# Patient Record
Sex: Female | Born: 1955 | Race: White | Hispanic: No | Smoking: Never smoker
Health system: Southern US, Community
[De-identification: ages and names within clinical notes are randomized; demographics above are authoritative.]

## PROBLEM LIST (undated history)

## (undated) DIAGNOSIS — K219 Gastro-esophageal reflux disease without esophagitis: Secondary | ICD-10-CM

## (undated) DIAGNOSIS — R1901 Right upper quadrant abdominal swelling, mass and lump: Secondary | ICD-10-CM

## (undated) DIAGNOSIS — Z9889 Other specified postprocedural states: Secondary | ICD-10-CM

## (undated) DIAGNOSIS — M720 Palmar fascial fibromatosis [Dupuytren]: Secondary | ICD-10-CM

## (undated) DIAGNOSIS — R030 Elevated blood-pressure reading, without diagnosis of hypertension: Secondary | ICD-10-CM

## (undated) DIAGNOSIS — M8580 Other specified disorders of bone density and structure, unspecified site: Secondary | ICD-10-CM

## (undated) DIAGNOSIS — B019 Varicella without complication: Secondary | ICD-10-CM

## (undated) DIAGNOSIS — B059 Measles without complication: Secondary | ICD-10-CM

## (undated) DIAGNOSIS — R42 Dizziness and giddiness: Secondary | ICD-10-CM

## (undated) DIAGNOSIS — Z9289 Personal history of other medical treatment: Secondary | ICD-10-CM

## (undated) DIAGNOSIS — R112 Nausea with vomiting, unspecified: Secondary | ICD-10-CM

## (undated) DIAGNOSIS — E785 Hyperlipidemia, unspecified: Secondary | ICD-10-CM

## (undated) DIAGNOSIS — M169 Osteoarthritis of hip, unspecified: Principal | ICD-10-CM

## (undated) DIAGNOSIS — T7840XA Allergy, unspecified, initial encounter: Secondary | ICD-10-CM

## (undated) DIAGNOSIS — M1990 Unspecified osteoarthritis, unspecified site: Secondary | ICD-10-CM

## (undated) DIAGNOSIS — L989 Disorder of the skin and subcutaneous tissue, unspecified: Secondary | ICD-10-CM

## (undated) DIAGNOSIS — K649 Unspecified hemorrhoids: Secondary | ICD-10-CM

## (undated) HISTORY — DX: Right upper quadrant abdominal swelling, mass and lump: R19.01

## (undated) HISTORY — DX: Elevated blood-pressure reading, without diagnosis of hypertension: R03.0

## (undated) HISTORY — DX: Palmar fascial fibromatosis (dupuytren): M72.0

## (undated) HISTORY — DX: Hypercalcemia: E83.52

## (undated) HISTORY — DX: Osteoarthritis of hip, unspecified: M16.9

## (undated) HISTORY — DX: Measles without complication: B05.9

## (undated) HISTORY — DX: Unspecified hemorrhoids: K64.9

## (undated) HISTORY — DX: Varicella without complication: B01.9

## (undated) HISTORY — PX: TONSILLECTOMY: SUR1361

## (undated) HISTORY — DX: Allergy, unspecified, initial encounter: T78.40XA

## (undated) HISTORY — DX: Hyperlipidemia, unspecified: E78.5

## (undated) HISTORY — PX: OTHER SURGICAL HISTORY: SHX169

## (undated) HISTORY — PX: SKIN LESION EXCISION: SHX2412

## (undated) HISTORY — DX: Dizziness and giddiness: R42

## (undated) HISTORY — DX: Unspecified osteoarthritis, unspecified site: M19.90

## (undated) HISTORY — DX: Other specified disorders of bone density and structure, unspecified site: M85.80

## (undated) HISTORY — DX: Disorder of the skin and subcutaneous tissue, unspecified: L98.9

---

## 1998-05-06 ENCOUNTER — Ambulatory Visit (HOSPITAL_COMMUNITY): Admission: RE | Admit: 1998-05-06 | Discharge: 1998-05-06 | Payer: Self-pay | Admitting: Obstetrics and Gynecology

## 2004-02-29 ENCOUNTER — Encounter: Admission: RE | Admit: 2004-02-29 | Discharge: 2004-02-29 | Payer: Self-pay | Admitting: Family Medicine

## 2004-03-22 ENCOUNTER — Other Ambulatory Visit: Admission: RE | Admit: 2004-03-22 | Discharge: 2004-03-22 | Payer: Self-pay | Admitting: Family Medicine

## 2006-07-12 ENCOUNTER — Ambulatory Visit: Payer: Self-pay | Admitting: Family Medicine

## 2007-07-17 ENCOUNTER — Ambulatory Visit: Payer: Self-pay | Admitting: Family Medicine

## 2007-12-31 ENCOUNTER — Ambulatory Visit: Payer: Self-pay | Admitting: Family Medicine

## 2008-04-02 HISTORY — PX: COLONOSCOPY: SHX174

## 2008-04-02 LAB — HM MAMMOGRAPHY

## 2008-11-10 ENCOUNTER — Ambulatory Visit: Payer: Self-pay | Admitting: Family Medicine

## 2008-11-10 ENCOUNTER — Other Ambulatory Visit: Admission: RE | Admit: 2008-11-10 | Discharge: 2008-11-10 | Payer: Self-pay | Admitting: Family Medicine

## 2008-11-17 ENCOUNTER — Encounter: Admission: RE | Admit: 2008-11-17 | Discharge: 2008-11-17 | Payer: Self-pay | Admitting: Family Medicine

## 2009-02-07 ENCOUNTER — Ambulatory Visit: Payer: Self-pay | Admitting: Family Medicine

## 2009-02-09 ENCOUNTER — Ambulatory Visit: Payer: Self-pay | Admitting: Family Medicine

## 2009-04-02 LAB — HM COLONOSCOPY

## 2009-05-13 ENCOUNTER — Ambulatory Visit: Payer: Self-pay | Admitting: Family Medicine

## 2009-06-07 ENCOUNTER — Ambulatory Visit: Payer: Self-pay | Admitting: Family Medicine

## 2009-08-08 ENCOUNTER — Ambulatory Visit: Payer: Self-pay | Admitting: Physician Assistant

## 2010-04-24 ENCOUNTER — Encounter: Payer: Self-pay | Admitting: Family Medicine

## 2010-08-05 ENCOUNTER — Encounter: Payer: Self-pay | Admitting: Family Medicine

## 2010-08-05 DIAGNOSIS — E559 Vitamin D deficiency, unspecified: Secondary | ICD-10-CM | POA: Insufficient documentation

## 2010-12-02 LAB — HM PAP SMEAR

## 2011-01-08 ENCOUNTER — Encounter: Payer: Self-pay | Admitting: Medical

## 2011-01-08 ENCOUNTER — Ambulatory Visit (INDEPENDENT_AMBULATORY_CARE_PROVIDER_SITE_OTHER): Payer: BC Managed Care – PPO | Admitting: Medical

## 2011-01-08 VITALS — BP 120/80 | HR 62 | Temp 97.9°F | Resp 18 | Ht 71.0 in | Wt 189.0 lb

## 2011-01-08 DIAGNOSIS — L720 Epidermal cyst: Secondary | ICD-10-CM

## 2011-01-08 DIAGNOSIS — L723 Sebaceous cyst: Secondary | ICD-10-CM

## 2011-01-08 NOTE — Patient Instructions (Signed)
If lesion is becoming painful, larger, or giving you problems in 1-2 months, call or return.

## 2011-01-08 NOTE — Progress Notes (Signed)
  Subjective:   HPI  Michelle Romero is a 55 y.o. female who presents for concern of left thumb lesion.   Lesion of left thumb appeared as raised spongy bump a week ago.  She notes that a tick bit her several months ago in the same place, thought this may be related to the new lesion.  Back months ago when she had the tick bite, she doesn't recall getting sick.  She notes otherwise feeling fine today, but a month ago thought she had Chad Nile virus due to headache, aches, fever, but this resolved.  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 The thumb lesion doesn't hurt.  Has put neosporin on the lesion without relief.  No other aggravating or relieving factors.  No other c/o.  The following portions of the patient's history were reviewed and updated as appropriate: allergies, current medications, past family history, past medical history, past social history, past surgical history and problem list.  Past Medical History  Diagnosis Date  . Hyperlipidemia   . Vitamin D deficiency       Review of Systems Constitutional: 17mo ago with fever, chills, aches;  denies sweats, unexpected weight change, fatigue ENT: no runny nose, ear pain, sore throat Cardiology: denies chest pain, palpitations, edema Respiratory: denies cough, shortness of breath, wheezing Gastroenterology: denies abdominal pain, nausea, vomiting, diarrhea, constipation Musculoskeletal: 17mo ago with aches, but otherwise denies back pain Urology: denies dysuria, difficulty urinating Neurology: bad headache 1 mo ago, but no weakness, tingling, numbness      Objective:   Physical Exam  General appearance: alert, no distress, WD/WN, white female Skin: left dorsal distal thumb with 1cm raised spongy flesh colored papular lesion, but no induration, erythema, no obvious fluctuance, warmth or fluid.  Seems spongy such as lipoma texture Heart: RRR, normal S1, S2, no murmurs Lungs: CTA bilaterally, no  wheezes, rhonchi, or rales Pulses: 2+ symmetric, upper and lower extremities, normal cap refill Neuro: normal sensation and strength of thumb and fingers   Assessment :    Encounter Diagnosis  Name Primary?  . Inclusion cyst Yes   Plan:   Reassured.   Advised if lesion not going away, or becoming troublesome over the next 1-2 mo, call or return, otherwise reassure that she can just leave it alone.

## 2011-04-25 ENCOUNTER — Ambulatory Visit
Admission: RE | Admit: 2011-04-25 | Discharge: 2011-04-25 | Disposition: A | Discharge status: Home / Self Care | Payer: BC Managed Care – PPO | Source: Ambulatory Visit | Attending: Medical | Admitting: Medical

## 2011-04-25 ENCOUNTER — Ambulatory Visit (INDEPENDENT_AMBULATORY_CARE_PROVIDER_SITE_OTHER): Payer: BC Managed Care – PPO | Admitting: Medical

## 2011-04-25 ENCOUNTER — Encounter: Payer: Self-pay | Admitting: Medical

## 2011-04-25 VITALS — BP 128/70 | HR 68 | Temp 97.6°F | Resp 16 | Wt 140.0 lb

## 2011-04-25 DIAGNOSIS — S60419A Abrasion of unspecified finger, initial encounter: Secondary | ICD-10-CM | POA: Insufficient documentation

## 2011-04-25 DIAGNOSIS — IMO0002 Reserved for concepts with insufficient information to code with codable children: Secondary | ICD-10-CM

## 2011-04-25 DIAGNOSIS — M79646 Pain in unspecified finger(s): Secondary | ICD-10-CM

## 2011-04-25 DIAGNOSIS — S62609A Fracture of unspecified phalanx of unspecified finger, initial encounter for closed fracture: Secondary | ICD-10-CM | POA: Insufficient documentation

## 2011-04-25 DIAGNOSIS — M79609 Pain in unspecified limb: Secondary | ICD-10-CM

## 2011-04-25 DIAGNOSIS — S6990XA Unspecified injury of unspecified wrist, hand and finger(s), initial encounter: Secondary | ICD-10-CM | POA: Insufficient documentation

## 2011-04-25 NOTE — Progress Notes (Signed)
  Subjective:   HPI Michelle Romero is a 56 y.o. female who presents for finger injury.  She was leading her horse through the gait on Saturday 5 days ago, when something spooked the horse, and the horse pulled on a rope which she was holding. She notes that the rope burned both of her ring fingers, and since then she has had pain and swelling in the ring finger on both hands.  The left hand is not as bad as the right hand, but she has lost some of the ability to completely extend her right ring finger.   She is using Neosporin on the abrasion, ice for swelling.  No other aggravating or relieving factors.  No other c/o.  The following portions of the patient's history were reviewed and updated as appropriate: allergies, current medications, past family history, past medical history, past social history, past surgical history and problem list.  Past Medical History  Diagnosis Date  . Hyperlipidemia   . Vitamin D deficiency     Review of Systems Gen.: No fever, chills, sweats Neuro: No numbness, weakness, tingling     Objective:   Physical Exam  General appearance: alert, no distress, WD/WN Skin: Left fourth finger volar aspect with 2.5 x 1 cm abrasion, right fourth finger along side nailbed with small abrasion MSK: Right fourth finger distal phalanx with swelling, erythema, and some purplish ecchymosis, tender to touch, unable to fully extend the distal phalanx of the same finger, decreased range of motion of the fourth finger. Left fourth finger with mild erythema, swelling, and slight purplish ecchymosis the distal phalanx, but normal range of motion. Mildly tender distal phalanx of the left fourth finger.  Otherwise bilateral hands and fingers nontender, normal range of motion, no obvious deformity Pulses: Normal cap refill bilateral, 2+ pulses of upper extremities Neuro:  Normal sensation bilateral hands and fingers   Assessment and Plan:    Encounter Diagnoses  Name Primary?  .  Finger injury Yes  . Abrasion of finger   . Finger pain   . Finger fracture, right    I sent the patient for x-ray today.   Results showed Xray right 4th finger: Oblique nondisplaced fracture through the base of the distal phalanx of the right fourth digit which may extend intra-articular to involve the fourth DIP joint.  Advise she keep both hands and fingers clean, continue over-the-counter Neosporin for the abrasions, discussed signs of worsening infection that would prompt recheck.  Advise she use over-the-counter splint and buddy taping for the left ring finger, elevation, ice. For tonight she can do the same with the right ring finger, ice, elevation. She can use over-the-counter Aleve or ibuprofen for pain and inflammation. We'll refer to orthopedics and try and get her in tomorrow for further evaluation and management.

## 2011-04-26 ENCOUNTER — Telehealth: Payer: Self-pay | Admitting: Family Medicine

## 2011-04-26 NOTE — Telephone Encounter (Signed)
PATIENT HAS AN APPOINTMENT WITH DR. HILTS AT [facility] ON 04/26/11 @145PM . CLS

## 2011-04-26 NOTE — Telephone Encounter (Signed)
Message copied by Janeice Robinson on Thu Apr 26, 2011  9:26 AM ------      Message from: Aleen Campi, DAVID S      Created: Wed Apr 25, 2011  7:34 PM       I spoke to patient last evening about the finger fracture. Please refer her to orthopedics for appointment on Thursday. Please send my office note, and copy of x-ray report. She has seen both [facility] and [facility] in the past. Try either of these.             RE: right 4th finger oblique fracture.

## 2011-10-30 ENCOUNTER — Ambulatory Visit (INDEPENDENT_AMBULATORY_CARE_PROVIDER_SITE_OTHER): Payer: BC Managed Care – PPO | Admitting: Family Medicine

## 2011-10-30 ENCOUNTER — Encounter: Payer: Self-pay | Admitting: Family Medicine

## 2011-10-30 VITALS — BP 138/78 | HR 80 | Temp 97.4°F | Ht 71.0 in | Wt 186.8 lb

## 2011-10-30 DIAGNOSIS — M25551 Pain in right hip: Secondary | ICD-10-CM

## 2011-10-30 DIAGNOSIS — B9789 Other viral agents as the cause of diseases classified elsewhere: Secondary | ICD-10-CM

## 2011-10-30 DIAGNOSIS — M1990 Unspecified osteoarthritis, unspecified site: Secondary | ICD-10-CM

## 2011-10-30 DIAGNOSIS — M129 Arthropathy, unspecified: Secondary | ICD-10-CM

## 2011-10-30 DIAGNOSIS — L989 Disorder of the skin and subcutaneous tissue, unspecified: Secondary | ICD-10-CM

## 2011-10-30 DIAGNOSIS — B349 Viral infection, unspecified: Secondary | ICD-10-CM

## 2011-10-30 DIAGNOSIS — E663 Overweight: Secondary | ICD-10-CM

## 2011-10-30 DIAGNOSIS — E559 Vitamin D deficiency, unspecified: Secondary | ICD-10-CM

## 2011-10-30 DIAGNOSIS — Z6827 Body mass index (BMI) 27.0-27.9, adult: Secondary | ICD-10-CM | POA: Insufficient documentation

## 2011-10-30 DIAGNOSIS — R319 Hematuria, unspecified: Secondary | ICD-10-CM

## 2011-10-30 DIAGNOSIS — R079 Chest pain, unspecified: Secondary | ICD-10-CM

## 2011-10-30 DIAGNOSIS — R109 Unspecified abdominal pain: Secondary | ICD-10-CM

## 2011-10-30 DIAGNOSIS — M25559 Pain in unspecified hip: Secondary | ICD-10-CM

## 2011-10-30 DIAGNOSIS — R42 Dizziness and giddiness: Secondary | ICD-10-CM

## 2011-10-30 HISTORY — DX: Dizziness and giddiness: R42

## 2011-10-30 HISTORY — DX: Disorder of the skin and subcutaneous tissue, unspecified: L98.9

## 2011-10-30 LAB — POCT URINALYSIS DIPSTICK
Bilirubin, UA: NEGATIVE
Glucose, UA: NEGATIVE
Ketones, UA: NEGATIVE
Leukocytes, UA: NEGATIVE
Nitrite, UA: NEGATIVE
Protein, UA: NEGATIVE
Spec Grav, UA: <=1.005
Urobilinogen, UA: 0.2
pH, UA: 5.5

## 2011-10-30 NOTE — Assessment & Plan Note (Signed)
Follows with dermatology, Dr Charlton Haws

## 2011-10-30 NOTE — Assessment & Plan Note (Signed)
Pain is helped with Tramadol prn. Does not respond to or tolerate NSAIDs

## 2011-10-30 NOTE — Progress Notes (Signed)
Patient ID: Michelle Romero, female   DOB: 1955/11/04, 56 y.o.   MRN: [id number] MARSHAYLA MITSCHKE [id number] 1955/05/05 10/30/2011      Progress Note-Follow Up  Subjective  Chief Complaint  Chief Complaint  Patient presents with  . Establish Care    new patient- transfer from Dr Susann Givens    HPI  Patient is Caucasian female who is here today to establish care. She is chronic right hip pain and arthritic changes. She does get some relief with joint injections and is trying over-the-counter supplements such as glucosamine pain continues to worsen. She is in today in a referral. She had an episode couple weeks ago where she stopped taking meloxicam. She reports she took a dose she felt lightheaded and vertiginous. She felt sweaty and nauseous and had some chest pain as well since stopping the medication she's had no further episodes. Today she reports she feels tired. She struggling with some anorexia as well as some nausea and some diarrhea. She has some mild congestion and some low. She's felt poorly for several days now. No high-grade fevers or worsening symptoms. No GI or GU complaints otherwise noted.  Past Medical History  Diagnosis Date  . Vitamin d deficiency   . Chicken pox as a child  . Measles as a child  . 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 Hyperlipidemia   . Overweight   . Precancerous skin lesion 10/30/2011    Left abdomen, excised  . Viral infection 10/30/2011  . Vertigo 10/30/2011    Past Surgical History  Procedure Date  . Colonoscopy 2010    [facility]  . Broke wrist 56 yrs old    plate placed by Dr Chaney Malling at [facility]  . Skin lesion excision     premelanoma on left abdominal wall    Family History  Problem Relation Age of Onset  . Stroke Mother   . Hypertension Mother   . Diabetes Mother     type 2  . Dementia Mother   . Cancer Father     melanoma- neck  . Arthritis Father   . Fibromyalgia Sister   . Cancer Maternal Grandmother     pancreatic  . Heart attack  Maternal Grandfather   . Cancer Paternal Grandmother     ?  Marland Kitchen Heart disease Paternal Grandfather     pacemaker  . Cancer Sister 67    ovarian  . Arthritis Brother     History   Social History  . Marital Status: Married    Spouse Name: N/A    Number of Children: N/A  . Years of Education: N/A   Occupational History  . Not on file.   Social History Main Topics  . Smoking status: Never Smoker   . Smokeless tobacco: Never Used  . Alcohol Use: 0.5 oz/week    1 drink(s) per week     rarely  . Drug Use: No  . Sexually Active: Not Currently   Other Topics Concern  . Not on file   Social History Narrative  . No narrative on file    No current outpatient prescriptions on file prior to visit.    Allergies  Allergen Reactions  . Codeine   . Meloxicam     Vertigo, dizziness, sweating, chest tightness    Review of Systems  Review of Systems  Constitutional: Positive for malaise/fatigue. Negative for fever.  HENT: Positive for congestion.   Eyes: Negative for discharge.  Respiratory: Negative for shortness of breath.  Cardiovascular: Negative for chest pain, palpitations and leg swelling.  Gastrointestinal: Positive for nausea, abdominal pain and diarrhea. Negative for constipation, blood in stool and melena.  Genitourinary: Negative for dysuria.  Musculoskeletal: Negative for falls.  Skin: Negative for rash.  Neurological: Positive for headaches. Negative for loss of consciousness.  Endo/Heme/Allergies: Negative for polydipsia.  Psychiatric/Behavioral: Negative for depression and suicidal ideas. The patient is not nervous/anxious and does not have insomnia.     Objective  BP 138/78  Pulse 80  Temp 97.4 F (36.3 C) (Temporal)  Ht 5\' 11"  (1.803 m)  Wt 186 lb 12.8 oz (84.732 kg)  BMI 26.05 kg/m2  SpO2 98%  Physical Exam  Physical Exam  Constitutional: She is oriented to person, place, and time and well-developed, well-nourished, and in no distress. No  distress.  HENT:  Head: Normocephalic and atraumatic.  Right Ear: External ear normal.  Left Ear: External ear normal.  Nose: Nose normal.  Mouth/Throat: Oropharynx is clear and moist. No oropharyngeal exudate.  Eyes: Conjunctivae are normal. Pupils are equal, round, and reactive to light. Right eye exhibits no discharge. Left eye exhibits no discharge. No scleral icterus.  Neck: Normal range of motion. Neck supple. No thyromegaly present.  Cardiovascular: Normal rate, regular rhythm, normal heart sounds and intact distal pulses.   No murmur heard. Pulmonary/Chest: Effort normal and breath sounds normal. No respiratory distress. She has no wheezes. She has no rales.  Abdominal: Soft. Bowel sounds are normal. She exhibits no distension and no mass. There is no tenderness.  Musculoskeletal: Normal range of motion. She exhibits no edema and no tenderness.  Lymphadenopathy:    She has no cervical adenopathy.  Neurological: She is alert and oriented to person, place, and time. She has normal reflexes. No cranial nerve deficit. Coordination normal.  Skin: Skin is warm and dry. No rash noted. She is not diaphoretic.  Psychiatric: Mood, memory and affect normal.     Assessment & Plan  Precancerous skin lesion Follows with dermatology, Dr Charlton Haws  Overweight Given paperwork on DASH diet check labs  Viral infection Encouraged increased rest and fluids and report worsening symptoms  Vitamin d deficiency Repeat level and start citracal  Arthritis Pain is helped with Tramadol prn. Does not respond to or tolerate NSAIDs  Vertigo Had an episode about 3 weeks ago with with nausea, diaphoresis, chest pain, resolved in a single morning and has not recurred. Will report any concerning symptoms. EKG normal

## 2011-10-30 NOTE — Assessment & Plan Note (Signed)
Repeat level and start citracal

## 2011-10-30 NOTE — Assessment & Plan Note (Signed)
Encouraged increased rest and fluids and report worsening symptoms

## 2011-10-30 NOTE — Patient Instructions (Addendum)
Preventive Care for Adults, Female A healthy lifestyle and preventive care can promote health and wellness. Preventive health guidelines for women include the following key practices.  A routine yearly physical is a good way to check with your caregiver about your health and preventive screening. It is a chance to share any concerns and updates on your health, and to receive a thorough exam.   Visit your dentist for a routine exam and preventive care every 6 months. Brush your teeth twice a day and floss once a day. Good oral hygiene prevents tooth decay and gum disease.   The frequency of eye exams is based on your age, health, family medical history, use of contact lenses, and other factors. Follow your caregiver's recommendations for frequency of eye exams.   Eat a healthy diet. Foods like vegetables, fruits, whole grains, low-fat dairy products, and lean protein foods contain the nutrients you need without too many calories. Decrease your intake of foods high in solid fats, added sugars, and salt. Eat the right amount of calories for you.Get information about a proper diet from your caregiver, if necessary.   Regular physical exercise is one of the most important things you can do for your health. Most adults should get at least 150 minutes of moderate-intensity exercise (any activity that increases your heart rate and causes you to sweat) each week. In addition, most adults need muscle-strengthening exercises on 2 or more days a week.   Maintain a healthy weight. The body mass index (BMI) is a screening tool to identify possible weight problems. It provides an estimate of body fat based on height and weight. Your caregiver can help determine your BMI, and can help you achieve or maintain a healthy weight.For adults 20 years and older:   A BMI below 18.5 is considered underweight.   A BMI of 18.5 to 24.9 is normal.   A BMI of 25 to 29.9 is considered overweight.   A BMI of 30 and above is  considered obese.   Maintain normal blood lipids and cholesterol levels by exercising and minimizing your intake of saturated fat. Eat a balanced diet with plenty of fruit and vegetables. Blood tests for lipids and cholesterol should begin at age 20 and be repeated every 5 years. If your lipid or cholesterol levels are high, you are over 50, or you are at high risk for heart disease, you may need your cholesterol levels checked more frequently.Ongoing high lipid and cholesterol levels should be treated with medicines if diet and exercise are not effective.   If you smoke, find out from your caregiver how to quit. If you do not use tobacco, do not start.   If you are pregnant, do not drink alcohol. If you are breastfeeding, be very cautious about drinking alcohol. If you are not pregnant and choose to drink alcohol, do not exceed 1 drink per day. One drink is considered to be 12 ounces (355 mL) of beer, 5 ounces (148 mL) of wine, or 1.5 ounces (44 mL) of liquor.   Avoid use of street drugs. Do not share needles with anyone. Ask for help if you need support or instructions about stopping the use of drugs.   High blood pressure causes heart disease and increases the risk of stroke. Your blood pressure should be checked at least every 1 to 2 years. Ongoing high blood pressure should be treated with medicines if weight loss and exercise are not effective.   If you are 55 to 57   years old, ask your caregiver if you should take aspirin to prevent strokes.   Diabetes screening involves taking a blood sample to check your fasting blood sugar level. This should be done once every 3 years, after age 45, if you are within normal weight and without risk factors for diabetes. Testing should be considered at a younger age or be carried out more frequently if you are overweight and have at least 1 risk factor for diabetes.   Breast cancer screening is essential preventive care for women. You should practice "breast  self-awareness." This means understanding the normal appearance and feel of your breasts and may include breast self-examination. Any changes detected, no matter how small, should be reported to a caregiver. Women in their 20s and 30s should have a clinical breast exam (CBE) by a caregiver as part of a regular health exam every 1 to 3 years. After age 40, women should have a CBE every year. Starting at age 40, women should consider having a mammography (breast X-ray test) every year. Women who have a family history of breast cancer should talk to their caregiver about genetic screening. Women at a high risk of breast cancer should talk to their caregivers about having magnetic resonance imaging (MRI) and a mammography every year.   The Pap test is a screening test for cervical cancer. A Pap test can show cell changes on the cervix that might become cervical cancer if left untreated. A Pap test is a procedure in which cells are obtained and examined from the lower end of the uterus (cervix).   Women should have a Pap test starting at age 21.   Between ages 21 and 29, Pap tests should be repeated every 2 years.   Beginning at age 30, you should have a Pap test every 3 years as long as the past 3 Pap tests have been normal.   Some women have medical problems that increase the chance of getting cervical cancer. Talk to your caregiver about these problems. It is especially important to talk to your caregiver if a new problem develops soon after your last Pap test. In these cases, your caregiver may recommend more frequent screening and Pap tests.   The above recommendations are the same for women who have or have not gotten the vaccine for human papillomavirus (HPV).   If you had a hysterectomy for a problem that was not cancer or a condition that could lead to cancer, then you no longer need Pap tests. Even if you no longer need a Pap test, a regular exam is a good idea to make sure no other problems are  starting.   If you are between ages 65 and 70, and you have had normal Pap tests going back 10 years, you no longer need Pap tests. Even if you no longer need a Pap test, a regular exam is a good idea to make sure no other problems are starting.   If you have had past treatment for cervical cancer or a condition that could lead to cancer, you need Pap tests and screening for cancer for at least 20 years after your treatment.   If Pap tests have been discontinued, risk factors (such as a new sexual partner) need to be reassessed to determine if screening should be resumed.   The HPV test is an additional test that may be used for cervical cancer screening. The HPV test looks for the virus that can cause the cell changes on the cervix.   The cells collected during the Pap test can be tested for HPV. The HPV test could be used to screen women aged 30 years and older, and should be used in women of any age who have unclear Pap test results. After the age of 30, women should have HPV testing at the same frequency as a Pap test.   Colorectal cancer can be detected and often prevented. Most routine colorectal cancer screening begins at the age of 50 and continues through age 75. However, your caregiver may recommend screening at an earlier age if you have risk factors for colon cancer. On a yearly basis, your caregiver may provide home test kits to check for hidden blood in the stool. Use of a small camera at the end of a tube, to directly examine the colon (sigmoidoscopy or colonoscopy), can detect the earliest forms of colorectal cancer. Talk to your caregiver about this at age 50, when routine screening begins. Direct examination of the colon should be repeated every 5 to 10 years through age 75, unless early forms of pre-cancerous polyps or small growths are found.   Hepatitis C blood testing is recommended for all people born from 1945 through 1965 and any individual with known risks for hepatitis C.    Practice safe sex. Use condoms and avoid high-risk sexual practices to reduce the spread of sexually transmitted infections (STIs). STIs include gonorrhea, chlamydia, syphilis, trichomonas, herpes, HPV, and human immunodeficiency virus (HIV). Herpes, HIV, and HPV are viral illnesses that have no cure. They can result in disability, cancer, and death. Sexually active women aged 25 and younger should be checked for chlamydia. Older women with new or multiple partners should also be tested for chlamydia. Testing for other STIs is recommended if you are sexually active and at increased risk.   Osteoporosis is a disease in which the bones lose minerals and strength with aging. This can result in serious bone fractures. The risk of osteoporosis can be identified using a bone density scan. Women ages 65 and over and women at risk for fractures or osteoporosis should discuss screening with their caregivers. Ask your caregiver whether you should take a calcium supplement or vitamin D to reduce the rate of osteoporosis.   Menopause can be associated with physical symptoms and risks. Hormone replacement therapy is available to decrease symptoms and risks. You should talk to your caregiver about whether hormone replacement therapy is right for you.   Use sunscreen with sun protection factor (SPF) of 30 or more. Apply sunscreen liberally and repeatedly throughout the day. You should seek shade when your shadow is shorter than you. Protect yourself by wearing long sleeves, pants, a wide-brimmed hat, and sunglasses year round, whenever you are outdoors.   Once a month, do a whole body skin exam, using a mirror to look at the skin on your back. Notify your caregiver of new moles, moles that have irregular borders, moles that are larger than a pencil eraser, or moles that have changed in shape or color.   Stay current with required immunizations.   Influenza. You need a dose every fall (or winter). The composition of  the flu vaccine changes each year, so being vaccinated once is not enough.   Pneumococcal polysaccharide. You need 1 to 2 doses if you smoke cigarettes or if you have certain chronic medical conditions. You need 1 dose at age 65 (or older) if you have never been vaccinated.   Tetanus, diphtheria, pertussis (Tdap, Td). Get 1 dose of   Tdap vaccine if you are younger than age 65, are over 65 and have contact with an infant, are a healthcare worker, are pregnant, or simply want to be protected from whooping cough. After that, you need a Td booster dose every 10 years. Consult your caregiver if you have not had at least 3 tetanus and diphtheria-containing shots sometime in your life or have a deep or dirty wound.   HPV. You need this vaccine if you are a woman age 26 or younger. The vaccine is given in 3 doses over 6 months.   Measles, mumps, rubella (MMR). You need at least 1 dose of MMR if you were born in 1957 or later. You may also need a second dose.   Meningococcal. If you are age 19 to 21 and a first-year college student living in a residence hall, or have one of several medical conditions, you need to get vaccinated against meningococcal disease. You may also need additional booster doses.   Zoster (shingles). If you are age 60 or older, you should get this vaccine.   Varicella (chickenpox). If you have never had chickenpox or you were vaccinated but received only 1 dose, talk to your caregiver to find out if you need this vaccine.   Hepatitis A. You need this vaccine if you have a specific risk factor for hepatitis A virus infection or you simply wish to be protected from this disease. The vaccine is usually given as 2 doses, 6 to 18 months apart.   Hepatitis B. You need this vaccine if you have a specific risk factor for hepatitis B virus infection or you simply wish to be protected from this disease. The vaccine is given in 3 doses, usually over 6 months.  Preventive Services /  Frequency Ages 19 to 39  Blood pressure check.** / Every 1 to 2 years.   Lipid and cholesterol check.** / Every 5 years beginning at age 20.   Clinical breast exam.** / Every 3 years for women in their 20s and 30s.   Pap test.** / Every 2 years from ages 21 through 29. Every 3 years starting at age 30 through age 65 or 70 with a history of 3 consecutive normal Pap tests.   HPV screening.** / Every 3 years from ages 30 through ages 65 to 70 with a history of 3 consecutive normal Pap tests.   Hepatitis C blood test.** / For any individual with known risks for hepatitis C.   Skin self-exam. / Monthly.   Influenza immunization.** / Every year.   Pneumococcal polysaccharide immunization.** / 1 to 2 doses if you smoke cigarettes or if you have certain chronic medical conditions.   Tetanus, diphtheria, pertussis (Tdap, Td) immunization. / A one-time dose of Tdap vaccine. After that, you need a Td booster dose every 10 years.   HPV immunization. / 3 doses over 6 months, if you are 26 and younger.   Measles, mumps, rubella (MMR) immunization. / You need at least 1 dose of MMR if you were born in 1957 or later. You may also need a second dose.   Meningococcal immunization. / 1 dose if you are age 19 to 21 and a first-year college student living in a residence hall, or have one of several medical conditions, you need to get vaccinated against meningococcal disease. You may also need additional booster doses.   Varicella immunization.** / Consult your caregiver.   Hepatitis A immunization.** / Consult your caregiver. 2 doses, 6 to 18 months   apart.   Hepatitis B immunization.** / Consult your caregiver. 3 doses usually over 6 months.  Ages 40 to 64  Blood pressure check.** / Every 1 to 2 years.   Lipid and cholesterol check.** / Every 5 years beginning at age 20.   Clinical breast exam.** / Every year after age 40.   Mammogram.** / Every year beginning at age 40 and continuing for as  long as you are in good health. Consult with your caregiver.   Pap test.** / Every 3 years starting at age 30 through age 65 or 70 with a history of 3 consecutive normal Pap tests.   HPV screening.** / Every 3 years from ages 30 through ages 65 to 70 with a history of 3 consecutive normal Pap tests.   Fecal occult blood test (FOBT) of stool. / Every year beginning at age 50 and continuing until age 75. You may not need to do this test if you get a colonoscopy every 10 years.   Flexible sigmoidoscopy or colonoscopy.** / Every 5 years for a flexible sigmoidoscopy or every 10 years for a colonoscopy beginning at age 50 and continuing until age 75.   Hepatitis C blood test.** / For all people born from 1945 through 1965 and any individual with known risks for hepatitis C.   Skin self-exam. / Monthly.   Influenza immunization.** / Every year.   Pneumococcal polysaccharide immunization.** / 1 to 2 doses if you smoke cigarettes or if you have certain chronic medical conditions.   Tetanus, diphtheria, pertussis (Tdap, Td) immunization.** / A one-time dose of Tdap vaccine. After that, you need a Td booster dose every 10 years.   Measles, mumps, rubella (MMR) immunization. / You need at least 1 dose of MMR if you were born in 1957 or later. You may also need a second dose.   Varicella immunization.** / Consult your caregiver.   Meningococcal immunization.** / Consult your caregiver.   Hepatitis A immunization.** / Consult your caregiver. 2 doses, 6 to 18 months apart.   Hepatitis B immunization.** / Consult your caregiver. 3 doses, usually over 6 months.  Ages 65 and over  Blood pressure check.** / Every 1 to 2 years.   Lipid and cholesterol check.** / Every 5 years beginning at age 20.   Clinical breast exam.** / Every year after age 40.   Mammogram.** / Every year beginning at age 40 and continuing for as long as you are in good health. Consult with your caregiver.   Pap test.** /  Every 3 years starting at age 30 through age 65 or 70 with a 3 consecutive normal Pap tests. Testing can be stopped between 65 and 70 with 3 consecutive normal Pap tests and no abnormal Pap or HPV tests in the past 10 years.   HPV screening.** / Every 3 years from ages 30 through ages 65 or 70 with a history of 3 consecutive normal Pap tests. Testing can be stopped between 65 and 70 with 3 consecutive normal Pap tests and no abnormal Pap or HPV tests in the past 10 years.   Fecal occult blood test (FOBT) of stool. / Every year beginning at age 50 and continuing until age 75. You may not need to do this test if you get a colonoscopy every 10 years.   Flexible sigmoidoscopy or colonoscopy.** / Every 5 years for a flexible sigmoidoscopy or every 10 years for a colonoscopy beginning at age 50 and continuing until age 75.   Hepatitis   C blood test.** / For all people born from 1945 through 1965 and any individual with known risks for hepatitis C.   Osteoporosis screening.** / A one-time screening for women ages 65 and over and women at risk for fractures or osteoporosis.   Skin self-exam. / Monthly.   Influenza immunization.** / Every year.   Pneumococcal polysaccharide immunization.** / 1 dose at age 65 (or older) if you have never been vaccinated.   Tetanus, diphtheria, pertussis (Tdap, Td) immunization. / A one-time dose of Tdap vaccine if you are over 65 and have contact with an infant, are a healthcare worker, or simply want to be protected from whooping cough. After that, you need a Td booster dose every 10 years.   Varicella immunization.** / Consult your caregiver.   Meningococcal immunization.** / Consult your caregiver.   Hepatitis A immunization.** / Consult your caregiver. 2 doses, 6 to 18 months apart.   Hepatitis B immunization.** / Check with your caregiver. 3 doses, usually over 6 months.  ** Family history and personal history of risk and conditions may change your caregiver's  recommendations. Document Released: 05/15/2001 Document Revised: 03/08/2011 Document Reviewed: 08/14/2010 [employer] Patient Information 2012 [employer]. 

## 2011-10-30 NOTE — Assessment & Plan Note (Signed)
Given paperwork on DASH diet check labs

## 2011-10-30 NOTE — Assessment & Plan Note (Signed)
Had an episode about 3 weeks ago with with nausea, diaphoresis, chest pain, resolved in a single morning and has not recurred. Will report any concerning symptoms. EKG normal

## 2011-11-01 LAB — URINE CULTURE: Colony Count: 10000

## 2011-11-02 ENCOUNTER — Other Ambulatory Visit (INDEPENDENT_AMBULATORY_CARE_PROVIDER_SITE_OTHER): Payer: BC Managed Care – PPO

## 2011-11-02 DIAGNOSIS — R109 Unspecified abdominal pain: Secondary | ICD-10-CM

## 2011-11-04 LAB — URINE CULTURE: Colony Count: 10000

## 2011-11-05 ENCOUNTER — Encounter: Payer: Self-pay | Admitting: Family Medicine

## 2011-11-05 NOTE — Progress Notes (Signed)
Quick Note:  Patient Informed and voiced understanding ______ 

## 2011-11-22 ENCOUNTER — Other Ambulatory Visit (INDEPENDENT_AMBULATORY_CARE_PROVIDER_SITE_OTHER): Payer: BC Managed Care – PPO

## 2011-11-22 DIAGNOSIS — Z0000 Encounter for general adult medical examination without abnormal findings: Secondary | ICD-10-CM

## 2011-11-22 DIAGNOSIS — E559 Vitamin D deficiency, unspecified: Secondary | ICD-10-CM

## 2011-11-22 LAB — LIPID PANEL
Cholesterol: 223 mg/dL — ABNORMAL HIGH (ref 0–200)
HDL: 61.3 mg/dL (ref >39.00)
Total CHOL/HDL Ratio: 4
Triglycerides: 83 mg/dL (ref 0.0–149.0)
VLDL: 16.6 mg/dL (ref 0.0–40.0)

## 2011-11-22 LAB — RENAL FUNCTION PANEL
Albumin: 3.6 g/dL (ref 3.5–5.2)
BUN: 12 mg/dL (ref 6–23)
CO2: 29 mEq/L (ref 19–32)
Calcium: 8.9 mg/dL (ref 8.4–10.5)
Chloride: 103 mEq/L (ref 96–112)
Creatinine, Ser: 0.5 mg/dL (ref 0.4–1.2)
GFR: 145.03 mL/min (ref >60.00)
Glucose, Bld: 77 mg/dL (ref 70–99)
Phosphorus: 3.1 mg/dL (ref 2.3–4.6)
Potassium: 4 mEq/L (ref 3.5–5.1)
Sodium: 137 mEq/L (ref 135–145)

## 2011-11-22 LAB — HEPATIC FUNCTION PANEL
ALT: 13 U/L (ref 0–35)
AST: 14 U/L (ref 0–37)
Albumin: 3.6 g/dL (ref 3.5–5.2)
Alkaline Phosphatase: 85 U/L (ref 39–117)
Bilirubin, Direct: 0 mg/dL (ref 0.0–0.3)
Total Bilirubin: 0.7 mg/dL (ref 0.3–1.2)
Total Protein: 6.4 g/dL (ref 6.0–8.3)

## 2011-11-22 LAB — CBC
HCT: 38.1 % (ref 36.0–46.0)
Hemoglobin: 12.6 g/dL (ref 12.0–15.0)
MCHC: 33 g/dL (ref 30.0–36.0)
MCV: 93.5 fl (ref 78.0–100.0)
Platelets: 246 10*3/uL (ref 150.0–400.0)
RBC: 4.08 Mil/uL (ref 3.87–5.11)
RDW: 13.9 % (ref 11.5–14.6)
WBC: 7.2 10*3/uL (ref 4.5–10.5)

## 2011-11-22 LAB — TSH: TSH: 1.28 u[IU]/mL (ref 0.35–5.50)

## 2011-11-22 LAB — LDL CHOLESTEROL, DIRECT: Direct LDL: 153.1 mg/dL

## 2011-11-23 ENCOUNTER — Other Ambulatory Visit: Payer: BC Managed Care – PPO

## 2011-11-26 LAB — VITAMIN D 1,25 DIHYDROXY
Vitamin D 1, 25 (OH)2 Total: 69 pg/mL (ref 18–72)
Vitamin D2 1, 25 (OH)2: <8 pg/mL
Vitamin D3 1, 25 (OH)2: 69 pg/mL

## 2011-11-26 NOTE — Progress Notes (Signed)
Quick Note:  Patient Informed and voiced understanding ______ 

## 2011-11-28 ENCOUNTER — Telehealth: Payer: Self-pay | Admitting: Family Medicine

## 2011-11-28 NOTE — Telephone Encounter (Signed)
LM

## 2011-11-30 ENCOUNTER — Encounter: Payer: Self-pay | Admitting: Family Medicine

## 2011-11-30 ENCOUNTER — Ambulatory Visit (INDEPENDENT_AMBULATORY_CARE_PROVIDER_SITE_OTHER): Payer: BC Managed Care – PPO | Admitting: Family Medicine

## 2011-11-30 VITALS — BP 144/78 | HR 70 | Ht 71.0 in | Wt 183.0 lb

## 2011-11-30 DIAGNOSIS — IMO0001 Reserved for inherently not codable concepts without codable children: Secondary | ICD-10-CM

## 2011-11-30 DIAGNOSIS — E559 Vitamin D deficiency, unspecified: Secondary | ICD-10-CM

## 2011-11-30 DIAGNOSIS — R030 Elevated blood-pressure reading, without diagnosis of hypertension: Secondary | ICD-10-CM | POA: Insufficient documentation

## 2011-11-30 DIAGNOSIS — E663 Overweight: Secondary | ICD-10-CM

## 2011-11-30 DIAGNOSIS — B9789 Other viral agents as the cause of diseases classified elsewhere: Secondary | ICD-10-CM

## 2011-11-30 DIAGNOSIS — E785 Hyperlipidemia, unspecified: Secondary | ICD-10-CM

## 2011-11-30 DIAGNOSIS — B349 Viral infection, unspecified: Secondary | ICD-10-CM

## 2011-11-30 HISTORY — DX: Reserved for inherently not codable concepts without codable children: IMO0001

## 2011-11-30 NOTE — Assessment & Plan Note (Signed)
Avoid trans fats, increase exercise, minimize caloric intake

## 2011-11-30 NOTE — Progress Notes (Signed)
Patient ID: Michelle Romero, female   DOB: 30-Dec-1955, 56 y.o.   MRN: [id number] Michelle Romero [id number] 10-Jun-1955 11/30/2011      Progress Note-Follow Up  Subjective  Chief Complaint  Chief Complaint  Patient presents with  . Follow-up    labs    HPI  Patient is a [age] Caucasian female who is in today for followup of multiple medical problems. She's recently had a steroid injection in her hip at [facility] and feels that has been marginally helpful. Otherwise she reports no acute illness. No trips to the emergency room, congestion, headache, chest pain, palpitations, shortness of breath, GI or GU complaints since her last visit.  Past Medical History  Diagnosis Date  . Vitamin d deficiency   . Chicken pox as a child  . Measles as a child  . 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 Hyperlipidemia   . Overweight   . Precancerous skin lesion 10/30/2011    Left abdomen, excised  . Viral infection 10/30/2011  . Vertigo 10/30/2011  . Elevated BP 11/30/2011    Past Surgical History  Procedure Date  . Colonoscopy 2010    [facility]  . Broke wrist 56 yrs old    plate placed by Dr Chaney Malling at [facility]  . Skin lesion excision     premelanoma on left abdominal wall    Family History  Problem Relation Age of Onset  . Stroke Mother   . Hypertension Mother   . Diabetes Mother     type 2  . Dementia Mother   . Cancer Father     melanoma- neck  . Arthritis Father   . Fibromyalgia Sister   . Cancer Maternal Grandmother     pancreatic  . Heart attack Maternal Grandfather   . Cancer Paternal Grandmother     ?  Marland Kitchen Heart disease Paternal Grandfather     pacemaker  . Cancer Sister 35    ovarian  . Arthritis Brother     History   Social History  . Marital Status: Married    Spouse Name: N/A    Number of Children: N/A  . Years of Education: N/A   Occupational History  . Not on file.   Social History Main Topics  . Smoking status: Never Smoker   . Smokeless tobacco: Never Used  .  Alcohol Use: 0.5 oz/week    1 drink(s) per week     rarely  . Drug Use: No  . Sexually Active: Not Currently   Other Topics Concern  . Not on file   Social History Narrative  . No narrative on file    Current Outpatient Prescriptions on File Prior to Visit  Medication Sig Dispense Refill  . acetaminophen (TYLENOL) 500 MG tablet Take 500 mg by mouth as needed.      Marland Kitchen aspirin 500 MG tablet Take 500 mg by mouth as needed. Takes 6-8      . DEVILS CLAW PO Take by mouth daily.      Marland Kitchen GLUCOSAMINE CHONDROITIN COMPLX PO Take by mouth.      . Methylsulfonylmethane (MSM) 1000 MG TABS Take 2,000 mg by mouth daily.      . NON FORMULARY Verigone      . NON FORMULARY Cherry extract- 2 tablespoons      . VITAMIN D, CHOLECALCIFEROL, PO Take by mouth daily.        Allergies  Allergen Reactions  . Codeine   . Meloxicam  Vertigo, dizziness, sweating, chest tightness    Review of Systems  Review of Systems  Constitutional: Negative for fever and malaise/fatigue.  HENT: Negative for congestion.   Eyes: Negative for discharge.  Respiratory: Negative for shortness of breath.   Cardiovascular: Negative for chest pain, palpitations and leg swelling.  Gastrointestinal: Negative for nausea, abdominal pain and diarrhea.  Genitourinary: Negative for dysuria.  Musculoskeletal: Positive for joint pain. Negative for falls.       Hip pain follows with [facility] had a recent steroid injection  Skin: Negative for rash.  Neurological: Negative for loss of consciousness and headaches.  Endo/Heme/Allergies: Negative for polydipsia.  Psychiatric/Behavioral: Negative for depression and suicidal ideas. The patient is not nervous/anxious and does not have insomnia.     Objective  BP 144/78  Pulse 70  Ht 5\' 11"  (1.803 m)  Wt 183 lb (83.008 kg)  BMI 25.52 kg/m2  SpO2 100%  Physical Exam  Physical Exam  Constitutional: She is oriented to person, place, and time and well-developed, well-nourished, and  in no distress. No distress.  HENT:  Head: Normocephalic and atraumatic.  Eyes: Conjunctivae are normal.  Neck: Neck supple. No thyromegaly present.  Cardiovascular: Normal rate, regular rhythm and normal heart sounds.   No murmur heard. Pulmonary/Chest: Effort normal and breath sounds normal. She has no wheezes.  Abdominal: She exhibits no distension and no mass.  Musculoskeletal: She exhibits no edema.  Lymphadenopathy:    She has no cervical adenopathy.  Neurological: She is alert and oriented to person, place, and time.  Skin: Skin is warm and dry. No rash noted. She is not diaphoretic.  Psychiatric: Memory, affect and judgment normal.    Lab Results  Component Value Date   TSH 1.28 11/22/2011   Lab Results  Component Value Date   WBC 7.2 11/22/2011   HGB 12.6 11/22/2011   HCT 38.1 11/22/2011   MCV 93.5 11/22/2011   PLT 246.0 11/22/2011   Lab Results  Component Value Date   CREATININE 0.5 11/22/2011   BUN 12 11/22/2011   NA 137 11/22/2011   K 4.0 11/22/2011   CL 103 11/22/2011   CO2 29 11/22/2011   Lab Results  Component Value Date   ALT 13 11/22/2011   AST 14 11/22/2011   ALKPHOS 85 11/22/2011   BILITOT 0.7 11/22/2011   Lab Results  Component Value Date   CHOL 223* 11/22/2011   Lab Results  Component Value Date   HDL 61.30 11/22/2011   No results found for this basename: [facility]   Lab Results  Component Value Date   TRIG 83.0 11/22/2011   Lab Results  Component Value Date   CHOLHDL 4 11/22/2011     Assessment & Plan  Elevated BP Given handout on DASH diet. Encouraged regular exercise and heart healthy diet. We'll continue to monitor and consider medications in the future her blood pressure remains elevated.  Hyperlipidemia Mild on recent labs and c/w patient's previous history, avoid trans fats, start Krill oil caps and DASH diet, increase exercise  Overweight Avoid trans fats, increase exercise, minimize caloric intake  Viral infection Resolved  symptoms  Vitamin d deficiency Levels wnl, no changes to therapy

## 2011-11-30 NOTE — Assessment & Plan Note (Signed)
Levels wnl, no changes to therapy

## 2011-11-30 NOTE — Assessment & Plan Note (Signed)
Given handout on DASH diet. Encouraged regular exercise and heart healthy diet. We'll continue to monitor and consider medications in the future her blood pressure remains elevated.

## 2011-11-30 NOTE — Patient Instructions (Addendum)

## 2011-11-30 NOTE — Assessment & Plan Note (Signed)
Mild on recent labs and c/w patient's previous history, avoid trans fats, start Krill oil caps and DASH diet, increase exercise

## 2011-11-30 NOTE — Assessment & Plan Note (Signed)
Resolved symptoms

## 2011-12-04 ENCOUNTER — Encounter: Payer: Self-pay | Admitting: Family Medicine

## 2012-01-30 ENCOUNTER — Ambulatory Visit (INDEPENDENT_AMBULATORY_CARE_PROVIDER_SITE_OTHER): Payer: BC Managed Care – PPO | Admitting: Family Medicine

## 2012-01-30 ENCOUNTER — Encounter: Payer: Self-pay | Admitting: Family Medicine

## 2012-01-30 VITALS — BP 143/82 | HR 70 | Temp 99.1°F | Ht 71.0 in | Wt 188.4 lb

## 2012-01-30 DIAGNOSIS — IMO0001 Reserved for inherently not codable concepts without codable children: Secondary | ICD-10-CM

## 2012-01-30 DIAGNOSIS — K219 Gastro-esophageal reflux disease without esophagitis: Secondary | ICD-10-CM

## 2012-01-30 DIAGNOSIS — M1990 Unspecified osteoarthritis, unspecified site: Secondary | ICD-10-CM

## 2012-01-30 DIAGNOSIS — T7840XA Allergy, unspecified, initial encounter: Secondary | ICD-10-CM

## 2012-01-30 DIAGNOSIS — M720 Palmar fascial fibromatosis [Dupuytren]: Secondary | ICD-10-CM

## 2012-01-30 DIAGNOSIS — R1901 Right upper quadrant abdominal swelling, mass and lump: Secondary | ICD-10-CM

## 2012-01-30 DIAGNOSIS — R1900 Intra-abdominal and pelvic swelling, mass and lump, unspecified site: Secondary | ICD-10-CM

## 2012-01-30 DIAGNOSIS — M129 Arthropathy, unspecified: Secondary | ICD-10-CM

## 2012-01-30 HISTORY — DX: Allergy, unspecified, initial encounter: T78.40XA

## 2012-01-30 HISTORY — DX: Right upper quadrant abdominal swelling, mass and lump: R19.01

## 2012-01-30 HISTORY — DX: Palmar fascial fibromatosis (dupuytren): M72.0

## 2012-01-30 MED ORDER — RANITIDINE HCL 150 MG PO TABS: 150.0000 mg | ORAL_TABLET | Freq: Two times a day (BID) | ORAL | Status: DC | PRN

## 2012-01-30 MED ORDER — CETIRIZINE HCL 10 MG PO TABS
10.0000 mg | ORAL_TABLET | Freq: Every day | ORAL | Status: DC | PRN
Start: 2012-01-30 — End: 2012-03-17

## 2012-01-30 NOTE — Assessment & Plan Note (Signed)
Right flank. Roughly 1 x 4 cm in diameter only mildly tender. Reports it has been present for years but has recently become more symptomatic. We'll proceed with CAT scan due to recent change in her bowels as well as his persistent discomfort and lesion.

## 2012-01-30 NOTE — Assessment & Plan Note (Signed)
Has a recent increased sense of PND and globus. Notes she has increased congestion and itching in her ears and her nose after she goes out to care for her course. Encouraged to start cetirizine daily to see if this is helpful to

## 2012-01-30 NOTE — Patient Instructions (Addendum)
Mucinex 600 mg twice daily for 10 days and increase hydration slightly Upper Respiratory Infection, Adult An upper respiratory infection (URI) is also known as the common cold. It is often caused by a type of germ (virus). Colds are easily spread (contagious). You can pass it to others by kissing, coughing, sneezing, or drinking out of the same glass. Usually, you get better in 1 or 2 weeks.  HOME CARE   Only take medicine as told by your doctor.  Use a warm mist humidifier or breathe in steam from a hot shower.  Drink enough water and fluids to keep your pee (urine) clear or pale yellow.  Get plenty of rest.  Return to work when your temperature is back to normal or as told by your doctor. You may use a face mask and wash your hands to stop your cold from spreading. GET HELP RIGHT AWAY IF:   After the first few days, you feel you are getting worse.  You have questions about your medicine.  You have chills, shortness of breath, or brown or red spit (mucus).  You have yellow or brown snot (nasal discharge) or pain in the face, especially when you bend forward.  You have a fever, puffy (swollen) neck, pain when you swallow, or white spots in the back of your throat.  You have a bad headache, ear pain, sinus pain, or chest pain.  You have a high-pitched whistling sound when you breathe in and out (wheezing).  You have a lasting cough or cough up blood.  You have sore muscles or a stiff neck. MAKE SURE YOU:   Understand these instructions.  Will watch your condition.  Will get help right away if you are not doing well or get worse. Document Released: 09/05/2007 Document Revised: 06/11/2011 Document Reviewed: 07/24/2010 [facility] Patient Information 2013 [place], [place].    Duputryn's contractions

## 2012-01-30 NOTE — Assessment & Plan Note (Signed)
Some nodules noted along tendon sheaths of 3rd and 4th digit and a nodule also noted at DIP joint of 2nd finger all on left hand. She is offered a referral to orthopaedics but declines for now, will call if she gets more symptomatic

## 2012-01-30 NOTE — Assessment & Plan Note (Signed)
Persistently painful. Is using an increased amount of Aspirin due to the pain at this time. He is due for another steroid injection next week and she does find she gets several weeks if not months worth of relief from that. At that time she will cut back on her aspirin.

## 2012-01-30 NOTE — Assessment & Plan Note (Signed)
Consider the possibility that some silent heartburn is contributing to her globus, especially with her increased Aspirin use, she is instructed to cut back on aspirin and to take it with food and started on a dose of Zantac 150 mg daily at bed and can increase to bid as needed, avoid fatty and spicy foods

## 2012-01-30 NOTE — Progress Notes (Signed)
Patient ID: Michelle Romero, female   DOB: 1956/02/07, 55 y.o.   MRN: [id number] Michelle Romero [id number] January 22, 1956 01/30/2012      Progress Note-Follow Up  Subjective  Chief Complaint  Chief Complaint  Patient presents with  . Asthma    OB/GYN heard asthma like breathes    HPI  Patient is a 70 Caucasian female who is in today for evaluation of multiple concerns. She was in to see Verlon Au and they discussed a lesion she's actually had in her right flank for many years. She reports years ago she had an ultrasound of the lesion it was unremarkable. It seems to come and go and sometimes more less tender. Recently it is more tender and she has noted a change in her bowels. She has dropped from having a bowel movement daily to roughly every other day and often has to strain. No bloody or tarry stool. No diarrhea or anorexia. No nausea or vomiting. She does note she's been taking a lot of aspirin lately secondary to her right hip pain and is complaining of a sense of globus or irritation in her throat as well as some recent coughing and possible wheezing. She feels her whole family is struggling with mild upper respiratory symptoms and she thinks in this regard she's actually improving. She does also note that when she goes out into the dusty stables to  take care of her horse she has increased nasal congestion and itching as well as itching in her ears and a sense of postnasal drip. No CP/palp/SOB/GU c/o  Past Medical History  Diagnosis Date  . Vitamin D deficiency   . Chicken pox as a child  . Measles as a child  . 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 Hyperlipidemia   . Overweight   . Precancerous skin lesion 10/30/2011    Left abdomen, excised  . Viral infection 10/30/2011  . Vertigo 10/30/2011  . Elevated BP 11/30/2011  . Dupuytren's contracture of hand 01/30/2012  . Abdominal mass, right upper quadrant 01/30/2012  . Allergic state 01/30/2012  . Reflux 01/30/2012    Past Surgical History   Procedure Date  . Colonoscopy 2010    [facility]  . Broke wrist 56 yrs old    plate placed by Dr Chaney Malling at [facility]  . Skin lesion excision     premelanoma on left abdominal wall    Family History  Problem Relation Age of Onset  . Stroke Mother   . Hypertension Mother   . Diabetes Mother     type 2  . Dementia Mother   . Cancer Father     melanoma- neck  . Arthritis Father   . Fibromyalgia Sister   . Cancer Maternal Grandmother     pancreatic  . Heart attack Maternal Grandfather   . Cancer Paternal Grandmother     ?  Marland Kitchen Heart disease Paternal Grandfather     pacemaker  . Cancer Sister 44    ovarian  . Arthritis Brother     History   Social History  . Marital Status: Married    Spouse Name: N/A    Number of Children: N/A  . Years of Education: N/A   Occupational History  . Not on file.   Social History Main Topics  . Smoking status: Never Smoker   . Smokeless tobacco: Never Used  . Alcohol Use: 0.5 oz/week    1 drink(s) per week     rarely  . Drug Use:  No  . Sexually Active: Not Currently   Other Topics Concern  . Not on file   Social History Narrative  . No narrative on file    Current Outpatient Prescriptions on File Prior to Visit  Medication Sig Dispense Refill  . acetaminophen (TYLENOL) 500 MG tablet Take 500 mg by mouth as needed.      Marland Kitchen aspirin 500 MG tablet Take 500 mg by mouth as needed. Takes 6-8      . DEVILS CLAW PO Take by mouth daily.      Marland Kitchen GLUCOSAMINE CHONDROITIN COMPLX PO Take by mouth.      . Methylsulfonylmethane (MSM) 1000 MG TABS Take 2,000 mg by mouth daily.      . NON FORMULARY Verigone      . NON FORMULARY Cherry extract- 2 tablespoons      . VITAMIN D, CHOLECALCIFEROL, PO Take by mouth daily.      . cetirizine (ZYRTEC) 10 MG tablet Take 1 tablet (10 mg total) by mouth daily as needed for allergies or rhinitis.  30 tablet  11  . ranitidine (ZANTAC) 150 MG tablet Take 1 tablet (150 mg total) by mouth 2 (two) times daily as  needed for heartburn.  60 tablet  1    Allergies  Allergen Reactions  . Codeine   . Meloxicam     Vertigo, dizziness, sweating, chest tightness    Review of Systems  Review of Systems  Constitutional: Negative for fever and malaise/fatigue.  HENT: Positive for congestion and sore throat. Negative for ear pain and ear discharge.   Eyes: Negative for discharge.  Respiratory: Positive for sputum production. Negative for shortness of breath.   Cardiovascular: Negative for chest pain, palpitations and leg swelling.  Gastrointestinal: Positive for abdominal pain and constipation. Negative for nausea and diarrhea.  Genitourinary: Negative for dysuria.  Musculoskeletal: Positive for joint pain. Negative for falls.       Right hip and left hand pain  Skin: Negative for rash.  Neurological: Negative for loss of consciousness and headaches.  Endo/Heme/Allergies: Negative for polydipsia.  Psychiatric/Behavioral: Negative for depression and suicidal ideas. The patient is not nervous/anxious and does not have insomnia.     Objective  BP 143/82  Pulse 70  Temp 99.1 F (37.3 C) (Temporal)  Ht 5\' 11"  (1.803 m)  Wt 188 lb 6.4 oz (85.458 kg)  BMI 26.28 kg/m2  SpO2 98%  Physical Exam  Physical Exam  Constitutional: She is oriented to person, place, and time and well-developed, well-nourished, and in no distress. No distress.  HENT:  Head: Normocephalic and atraumatic.  Eyes: Conjunctivae normal are normal.  Neck: Neck supple. No thyromegaly present.  Cardiovascular: Normal rate, regular rhythm and normal heart sounds.   No murmur heard. Pulmonary/Chest: Effort normal and breath sounds normal. She has no wheezes.  Abdominal: Soft. Bowel sounds are normal. She exhibits mass. She exhibits no distension. There is tenderness. There is no rebound and no guarding.       Right flank, 1 x 4 cm firm, semimobile mass palpable, mildly tender to palp  Musculoskeletal: She exhibits no edema.   Lymphadenopathy:    She has no cervical adenopathy.  Neurological: She is alert and oriented to person, place, and time.  Skin: Skin is warm and dry. No rash noted. She is not diaphoretic.  Psychiatric: Memory, affect and judgment normal.    Lab Results  Component Value Date   TSH 1.28 11/22/2011   Lab Results  Component Value Date  WBC 7.2 11/22/2011   HGB 12.6 11/22/2011   HCT 38.1 11/22/2011   MCV 93.5 11/22/2011   PLT 246.0 11/22/2011   Lab Results  Component Value Date   CREATININE 0.5 11/22/2011   BUN 12 11/22/2011   NA 137 11/22/2011   K 4.0 11/22/2011   CL 103 11/22/2011   CO2 29 11/22/2011   Lab Results  Component Value Date   ALT 13 11/22/2011   AST 14 11/22/2011   ALKPHOS 85 11/22/2011   BILITOT 0.7 11/22/2011   Lab Results  Component Value Date   CHOL 223* 11/22/2011   Lab Results  Component Value Date   HDL 61.30 11/22/2011   No results found for this basename: [facility]   Lab Results  Component Value Date   TRIG 83.0 11/22/2011   Lab Results  Component Value Date   CHOLHDL 4 11/22/2011     Assessment & Plan  Dupuytren's contracture of hand Some nodules noted along tendon sheaths of 3rd and 4th digit and a nodule also noted at DIP joint of 2nd finger all on left hand. She is offered a referral to orthopaedics but declines for now, will call if she gets more symptomatic  Arthritis Persistently painful. Is using an increased amount of Aspirin due to the pain at this time. He is due for another steroid injection next week and she does find she gets several weeks if not months worth of relief from that. At that time she will cut back on her aspirin.   Abdominal mass, right upper quadrant Right flank. Roughly 1 x 4 cm in diameter only mildly tender. Reports it has been present for years but has recently become more symptomatic. We'll proceed with CAT scan due to recent change in her bowels as well as his persistent discomfort and lesion.  Allergic state Has a  recent increased sense of PND and globus. Notes she has increased congestion and itching in her ears and her nose after she goes out to care for her course. Encouraged to start cetirizine daily to see if this is helpful to  Reflux Consider the possibility that some silent heartburn is contributing to her globus, especially with her increased Aspirin use, she is instructed to cut back on aspirin and to take it with food and started on a dose of Zantac 150 mg daily at bed and can increase to bid as needed, avoid fatty and spicy foods

## 2012-02-01 ENCOUNTER — Ambulatory Visit (HOSPITAL_BASED_OUTPATIENT_CLINIC_OR_DEPARTMENT_OTHER)
Admission: RE | Admit: 2012-02-01 | Discharge: 2012-02-01 | Disposition: A | Discharge status: Home / Self Care | Payer: BC Managed Care – PPO | Source: Ambulatory Visit | Attending: Family Medicine | Admitting: Family Medicine

## 2012-02-01 DIAGNOSIS — R1900 Intra-abdominal and pelvic swelling, mass and lump, unspecified site: Secondary | ICD-10-CM | POA: Insufficient documentation

## 2012-02-01 DIAGNOSIS — R198 Other specified symptoms and signs involving the digestive system and abdomen: Secondary | ICD-10-CM | POA: Insufficient documentation

## 2012-02-01 DIAGNOSIS — R109 Unspecified abdominal pain: Secondary | ICD-10-CM | POA: Insufficient documentation

## 2012-02-01 MED ORDER — IOHEXOL 300 MG/ML  SOLN
100.0000 mL | Freq: Once | INTRAMUSCULAR | Status: AC | PRN
  Administered 2012-02-01: 100 mL via INTRAVENOUS

## 2012-02-01 NOTE — Progress Notes (Signed)
Quick Note:  Patient Informed and voiced understanding ______ 

## 2012-02-14 ENCOUNTER — Other Ambulatory Visit: Payer: Self-pay | Admitting: Orthopedic Surgery

## 2012-02-14 MED ORDER — DEXAMETHASONE SODIUM PHOSPHATE 10 MG/ML IJ SOLN: 10.0000 mg | Freq: Once | INTRAMUSCULAR | Status: DC

## 2012-02-14 MED ORDER — BUPIVACAINE LIPOSOME 1.3 % IJ SUSP: 20.0000 mL | Freq: Once | INTRAMUSCULAR | Status: DC

## 2012-02-14 NOTE — Progress Notes (Signed)
Preoperative surgical orders have been place into the [facility] hospital system for Michelle Romero on 02/14/2012, 11:26 AM  by Patrica Duel for surgery on 04/16/12.  Preop Total Hip - Anterior Approach orders including Experel Injecion, IV Tylenol, and IV Decadron as long as there are no contraindications to the above medications. [name], PA-C

## 2012-03-17 ENCOUNTER — Encounter: Payer: Self-pay | Admitting: Family Medicine

## 2012-03-17 ENCOUNTER — Ambulatory Visit (INDEPENDENT_AMBULATORY_CARE_PROVIDER_SITE_OTHER): Payer: BC Managed Care – PPO | Admitting: Family Medicine

## 2012-03-17 VITALS — BP 142/82 | HR 73 | Temp 99.4°F | Ht 71.0 in | Wt 186.8 lb

## 2012-03-17 DIAGNOSIS — E559 Vitamin D deficiency, unspecified: Secondary | ICD-10-CM

## 2012-03-17 DIAGNOSIS — M169 Osteoarthritis of hip, unspecified: Secondary | ICD-10-CM

## 2012-03-17 DIAGNOSIS — M129 Arthropathy, unspecified: Secondary | ICD-10-CM

## 2012-03-17 DIAGNOSIS — IMO0001 Reserved for inherently not codable concepts without codable children: Secondary | ICD-10-CM

## 2012-03-17 DIAGNOSIS — M1990 Unspecified osteoarthritis, unspecified site: Secondary | ICD-10-CM

## 2012-03-17 DIAGNOSIS — K219 Gastro-esophageal reflux disease without esophagitis: Secondary | ICD-10-CM

## 2012-03-17 DIAGNOSIS — E785 Hyperlipidemia, unspecified: Secondary | ICD-10-CM

## 2012-03-17 DIAGNOSIS — R030 Elevated blood-pressure reading, without diagnosis of hypertension: Secondary | ICD-10-CM

## 2012-03-17 DIAGNOSIS — Z01818 Encounter for other preprocedural examination: Secondary | ICD-10-CM

## 2012-03-17 HISTORY — DX: Osteoarthritis of hip, unspecified: M16.9

## 2012-03-17 LAB — RENAL FUNCTION PANEL
Albumin: 4.1 g/dL (ref 3.5–5.2)
BUN: 11 mg/dL (ref 6–23)
CO2: 30 meq/L (ref 19–32)
Calcium: 9.4 mg/dL (ref 8.4–10.5)
Chloride: 101 meq/L (ref 96–112)
Creatinine, Ser: 0.6 mg/dL (ref 0.4–1.2)
GFR: 101.45 mL/min
Glucose, Bld: 81 mg/dL (ref 70–99)
Phosphorus: 3.5 mg/dL (ref 2.3–4.6)
Potassium: 4.1 meq/L (ref 3.5–5.1)
Sodium: 140 meq/L (ref 135–145)

## 2012-03-17 LAB — HEPATIC FUNCTION PANEL
ALT: 14 U/L (ref 0–35)
AST: 17 U/L (ref 0–37)
Albumin: 4.1 g/dL (ref 3.5–5.2)
Alkaline Phosphatase: 89 U/L (ref 39–117)
Bilirubin, Direct: 0 mg/dL (ref 0.0–0.3)
Total Bilirubin: 0.8 mg/dL (ref 0.3–1.2)
Total Protein: 7.3 g/dL (ref 6.0–8.3)

## 2012-03-17 LAB — CBC
HCT: 38 % (ref 36.0–46.0)
Hemoglobin: 12.9 g/dL (ref 12.0–15.0)
MCHC: 34.1 g/dL (ref 30.0–36.0)
MCV: 91.5 fl (ref 78.0–100.0)
Platelets: 252 K/uL (ref 150.0–400.0)
RBC: 4.15 Mil/uL (ref 3.87–5.11)
RDW: 13.5 % (ref 11.5–14.6)
WBC: 6.2 K/uL (ref 4.5–10.5)

## 2012-03-17 MED ORDER — RANITIDINE HCL 150 MG PO TABS: 150.0000 mg | ORAL_TABLET | Freq: Two times a day (BID) | ORAL | Status: DC | PRN

## 2012-03-17 NOTE — Assessment & Plan Note (Signed)
wnl with last labs no changes today

## 2012-03-17 NOTE — Progress Notes (Signed)
Patient ID: Michelle Romero, female   DOB: 12-05-1955, 56 y.o.   MRN: [id number] Michelle Romero [id number] 1956-01-04 03/17/2012      Progress Note-Follow Up  Subjective  Chief Complaint  Chief Complaint  Patient presents with  . surgery clearance    right hip replacement on 04-16-12    HPI  2 Caucasian female in today for preop clearance. She is scheduled to have her right hip total replacement 04/16/2012 Dr. Despina Hick at [facility] and is anxious to proceed. She struggles with daily pain. No other acute complaints. Did have an episode of heartburn and after her CT scan last month but notes that is significantly better at this time. She has been using baking soda and and reducing her. She never feels again. She's still having episodes on occasion but not debilitating. No bowel changes otherwise. Chest pain, palpitations, shortness of breath, fevers, chills, headache, GU complaints noted today.  Past Medical History  Diagnosis Date  . Vitamin D deficiency   . Chicken pox as a child  . Measles as a child  . 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 Hyperlipidemia   . Overweight   . Precancerous skin lesion 10/30/2011    Left abdomen, excised  . Viral infection 10/30/2011  . Vertigo 10/30/2011  . Elevated BP 11/30/2011  . Dupuytren's contracture of hand 01/30/2012  . Abdominal mass, right upper quadrant 01/30/2012  . Allergic state 01/30/2012  . Reflux 01/30/2012  . DJD (degenerative joint disease) of hip 03/17/2012    Past Surgical History  Procedure Date  . Colonoscopy 2010    [facility]  . Broke wrist 56 yrs old    plate placed by Dr Chaney Malling at [facility]  . Skin lesion excision     premelanoma on left abdominal wall    Family History  Problem Relation Age of Onset  . Stroke Mother   . Hypertension Mother   . Diabetes Mother     type 2  . Dementia Mother   . Cancer Father     melanoma- neck  . Arthritis Father   . Fibromyalgia Sister   . Cancer Maternal Grandmother    pancreatic  . Heart attack Maternal Grandfather   . Cancer Paternal Grandmother     ?  Marland Kitchen Heart disease Paternal Grandfather     pacemaker  . Cancer Sister 33    ovarian  . Arthritis Brother     History   Social History  . Marital Status: Married    Spouse Name: N/A    Number of Children: N/A  . Years of Education: N/A   Occupational History  . Not on file.   Social History Main Topics  . Smoking status: Never Smoker   . Smokeless tobacco: Never Used  . Alcohol Use: 0.5 oz/week    1 drink(s) per week     Comment: rarely  . Drug Use: No  . Sexually Active: Not Currently   Other Topics Concern  . Not on file   Social History Narrative  . No narrative on file    Current Outpatient Prescriptions on File Prior to Visit  Medication Sig Dispense Refill  . aspirin 500 MG tablet Take 500 mg by mouth as needed. Takes 4      . DEVILS CLAW PO Take by mouth daily.      Marland Kitchen GLUCOSAMINE CHONDROITIN COMPLX PO Take by mouth.      . Methylsulfonylmethane (MSM) 1000 MG TABS Take 2,000 mg by  mouth daily.      . NON FORMULARY Verigone      . NON FORMULARY Cherry extract- 2 tablespoons      . VITAMIN D, CHOLECALCIFEROL, PO Take by mouth daily.        Allergies  Allergen Reactions  . Codeine   . Meloxicam     Vertigo, dizziness, sweating, chest tightness    Review of Systems  Review of Systems  Constitutional: Negative for fever and malaise/fatigue.  HENT: Negative for congestion.   Eyes: Negative for discharge.  Respiratory: Negative for shortness of breath.   Cardiovascular: Negative for chest pain, palpitations and leg swelling.  Gastrointestinal: Negative for nausea, abdominal pain and diarrhea.  Genitourinary: Negative for dysuria.  Musculoskeletal: Negative for falls.  Skin: Negative for rash.  Neurological: Negative for loss of consciousness and headaches.  Endo/Heme/Allergies: Negative for polydipsia.  Psychiatric/Behavioral: Negative for depression and suicidal  ideas. The patient is not nervous/anxious and does not have insomnia.     Objective  BP 142/82  Pulse 73  Temp 99.4 F (37.4 C) (Temporal)  Ht 5\' 11"  (1.803 m)  Wt 186 lb 12.8 oz (84.732 kg)  BMI 26.05 kg/m2  SpO2 97%  Physical Exam  Physical Exam  Constitutional: She is oriented to person, place, and time and well-developed, well-nourished, and in no distress. No distress.  HENT:  Head: Normocephalic and atraumatic.  Right Ear: External ear normal.  Left Ear: External ear normal.  Nose: Nose normal.  Mouth/Throat: Oropharynx is clear and moist. No oropharyngeal exudate.  Eyes: Conjunctivae normal are normal. Pupils are equal, round, and reactive to light. Right eye exhibits no discharge. Left eye exhibits no discharge. No scleral icterus.  Neck: Normal range of motion. Neck supple. No thyromegaly present.  Cardiovascular: Normal rate, regular rhythm, normal heart sounds and intact distal pulses.   No murmur heard. Pulmonary/Chest: Effort normal and breath sounds normal. No respiratory distress. She has no wheezes. She has no rales.  Abdominal: Soft. Bowel sounds are normal. She exhibits no distension and no mass. There is no tenderness.  Musculoskeletal: Normal range of motion. She exhibits no edema and no tenderness.  Lymphadenopathy:    She has no cervical adenopathy.  Neurological: She is alert and oriented to person, place, and time. She has normal reflexes. No cranial nerve deficit. Coordination normal.  Skin: Skin is warm and dry. No rash noted. She is not diaphoretic.  Psychiatric: Mood, memory and affect normal.    Lab Results  Component Value Date   TSH 1.28 11/22/2011   Lab Results  Component Value Date   WBC 7.2 11/22/2011   HGB 12.6 11/22/2011   HCT 38.1 11/22/2011   MCV 93.5 11/22/2011   PLT 246.0 11/22/2011   Lab Results  Component Value Date   CREATININE 0.5 11/22/2011   BUN 12 11/22/2011   NA 137 11/22/2011   K 4.0 11/22/2011   CL 103 11/22/2011   CO2 29  11/22/2011   Lab Results  Component Value Date   ALT 13 11/22/2011   AST 14 11/22/2011   ALKPHOS 85 11/22/2011   BILITOT 0.7 11/22/2011   Lab Results  Component Value Date   CHOL 223* 11/22/2011   Lab Results  Component Value Date   HDL 61.30 11/22/2011   No results found for this basename: [facility]   Lab Results  Component Value Date   TRIG 83.0 11/22/2011   Lab Results  Component Value Date   CHOLHDL 4 11/22/2011  Assessment & Plan  Arthritis Has surgery scheduled 15 2014 Lucille. Is scheduled for total right hip replacement. And to proceed. Exam is unremarkable today provided no changes in her medical condition. She's advised to stop all her medications prior to her scheduled surgery 5 weeks.  Elevated BP Adequately controlled today, advised minimize sodium and caffeine  Vitamin D deficiency wnl with last labs no changes today  Hyperlipidemia Mild, avoid trans fats  Reflux Recent flare, encouraged to try Ranitidine 150 mg qhs for a week and then as needed

## 2012-03-17 NOTE — Assessment & Plan Note (Signed)
Recent flare, encouraged to try Ranitidine 150 mg qhs for a week and then as needed

## 2012-03-17 NOTE — Assessment & Plan Note (Signed)
Adequately controlled today, advised minimize sodium and caffeine

## 2012-03-17 NOTE — Assessment & Plan Note (Signed)
Has surgery scheduled 15 2014 Lucille. Is scheduled for total right hip replacement. And to proceed. Exam is unremarkable today provided no changes in her medical condition. She's advised to stop all her medications prior to her scheduled surgery 5 weeks.

## 2012-03-17 NOTE — Assessment & Plan Note (Signed)
Mild, avoid trans fats

## 2012-03-17 NOTE — Patient Instructions (Addendum)

## 2012-03-18 NOTE — Progress Notes (Signed)
Quick Note:  Patient Informed and voiced understanding ______ 

## 2012-03-31 ENCOUNTER — Other Ambulatory Visit: Payer: Self-pay | Admitting: Orthopedic Surgery

## 2012-03-31 NOTE — H&P (Signed)
Michelle Romero  DOB: 02-09-1956 Married / Language: English / Race: White Female  Date of Admission:  04/16/2012  Chief Complaint:  Right Hip Pain  History of Present Illness The patient is a 56 year old female who comes in for a preoperative History and Physical. The patient is scheduled for a right total hip arthroplasty (Anterior Approach) to be performed by Dr. Gus Rankin. Aluisio, MD at [facility] on 04/16/2012. The patient is a 56 year old female who presents with a hip problem. The patient was seen in referral from Dr. Rogelia Rohrer.The patient reports right hip problems including pain, giving way and stiffness symptoms that have been present for 20 year(s). The symptoms began without any known injury.The patient feels as if their symptoms are does feel they are worsening. Current treatment includes nonsteroidal anti-inflammatory drugs (aspirin, along with Tylenol prn). Prior to being seen today the patient was previously evaluated by a colleague (Dr. Madelon Lips at [facility]). Previous workup for this problem has included hip x-rays. Previous treatment for this problem has included corticosteroid injection (these have been done by Dr. Alvester Morin at [facility]. She states that he has also discuss possible injections in her hip) and physical therapy. She states that the hip is getting progressively worse. The injections helped for only a short amount of time. She is having worsening stiffness in the hip also. She is into horseback riding and has horses. She is having a more difficult time doing so because of the very limited movement of her hip. She is not having problems currently with her left hip. She is not having any lower extremity weakness or paresthesia. She occasionally gets knee pain radiating from the hip. She is ready to get the hip fixed. They have been treated conservatively in the past for the above stated problem and despite conservative measures, they continue to  have progressive pain and severe functional limitations and dysfunction. They have failed non-operative management including home exercise, medications, and injections. It is felt that they would benefit from undergoing total joint replacement. Risks and benefits of the procedure have been discussed with the patient and they elect to proceed with surgery. There are no active contraindications to surgery such as ongoing infection or rapidly progressive neurological disease.   Problem List Osteoarthritis, Hip (715.35)   Allergies Mobic *ANALGESICS - ANTI-INFLAMMATORY*. Dizziness, Diarrhea. Chest Pain   Family History Cerebrovascular Accident. mother Diabetes Mellitus. mother Hypertension. mother Osteoarthritis. father Rheumatoid Arthritis. father   Social History Exercise. Exercises daily; does running / walking and other Illicit drug use. no Living situation. live with spouse Current work status. seeking work Drug/Alcohol Rehab (Currently). no Drug/Alcohol Rehab (Previously). no Tobacco use. never smoker Marital status. married Number of flights of stairs before winded. 2-3 Pain Contract. no Alcohol use. current drinker; drinks wine; only occasionally per week Children. 2 Post-Surgical Plans. Plan is to go home. Advance Directives. Living Will, Healthcare POA   Medication History Aspirin EC (81MG  Tablet DR, 4 Oral daily) Active. Glucosamine Chondroitin Complx ( Oral) Active. Methylsulfonylmethane (1000MG  Tablet, Oral) Active. Cholecalciferol (2000UNIT Tablet, Oral) Active. Zantac (150MG  Tablet, Oral) Active. Trace Minerals (Cr-Zn) (0.2-15MG  Tablet, Oral) Active. Magnesium Oxide (500MG  Tablet, Oral) Active.   Pregnancy / Birth History Pregnant. no   Past Surgical History Tonsillectomy Dilation and Curettage of Uterus Wrist Surgery. Date: 2008.   Medical History Varicose veins Gastroesophageal Reflux Disease Menopause Vitamin D  Deficiency Hyperlipidemia   Review of Systems General:Not Present- Chills, Fever, Night Sweats, Fatigue, Weight Gain, Weight  Loss and Memory Loss. Skin:Not Present- Hives, Itching, Rash, Eczema and Lesions. HEENT:Not Present- Tinnitus, Headache, Double Vision, Visual Loss, Hearing Loss and Dentures. Respiratory:Not Present- Shortness of breath with exertion, Shortness of breath at rest, Allergies, Coughing up blood and Chronic Cough. Cardiovascular:Not Present- Chest Pain, Racing/skipping heartbeats, Difficulty Breathing Lying Down, Murmur, Swelling and Palpitations. Gastrointestinal:Present- Constipation. Not Present- Bloody Stool, Heartburn, Abdominal Pain, Vomiting, Nausea, Diarrhea, Difficulty Swallowing, Jaundice and Loss of appetitie. Female Genitourinary:Not Present- Blood in Urine, Urinary frequency, Weak urinary stream, Discharge, Flank Pain, Incontinence, Painful Urination, Urgency, Urinary Retention and Urinating at Night. Musculoskeletal:Present- Morning Stiffness. Not Present- Muscle Weakness, Muscle Pain, Joint Swelling, Joint Pain, Back Pain and Spasms. Neurological:Not Present- Tremor, Dizziness, Blackout spells, Paralysis, Difficulty with balance and Weakness. Psychiatric:Not Present- Insomnia.   Vitals Weight: 186 lb Height: 71 in Body Surface Area: 2.06 m Body Mass Index: 25.94 kg/m Pulse: 64 (Regular) Resp.: 12 (Unlabored) BP: 144/88 (Sitting, Left Arm, Standard)    Physical Exam The physical exam findings are as follows:  Note: Patient is a 56 year old female with continued hip pain.   General Mental Status - Alert, cooperative and good historian. General Appearance- pleasant. Not in acute distress. Orientation- Oriented X3. Build & Nutrition- Well nourished and Well developed.   Head and Neck Head- normocephalic, atraumatic . Neck Global Assessment- supple. no bruit auscultated on the right and no bruit auscultated on  the left.   Eye Pupil- Bilateral- Regular and Round. Motion- Bilateral- EOMI. wears glasses  Chest and Lung Exam Auscultation: Breath sounds:- clear at anterior chest wall and - clear at posterior chest wall. Adventitious sounds:- No Adventitious sounds.   Cardiovascular Auscultation:Rhythm- Regular rate and rhythm. Heart Sounds- S1 WNL and S2 WNL. Murmurs & Other Heart Sounds:Auscultation of the heart reveals - No Murmurs.   Abdomen Palpation/Percussion:Tenderness- Abdomen is non-tender to palpation. Rigidity (guarding)- Abdomen is soft. Auscultation:Auscultation of the abdomen reveals - Bowel sounds normal.   Female Genitourinary Not done, not pertinent to present illness  Musculoskeletal Well developed female, alert and oriented in no apparent distress. The left hip has normal range of motion without discomfort. Her right hip can be flexed to 95. No internal or external rotation, only about 10-15 degrees of abduction. Her knee exam is normal on both sides. Pulse, sensation, and motor are intact distally.  RADIOGRAPHS: AP pelvis, AP and lateral of the right hip show that she has bone on bone arthritis with some erosion of the femoral head. She also has arthritic change on the left hip, but no where near as bad as the right.  Assessment & Plan Osteoarthritis, Hip (715.35) Impression: Right Hip  Note: Plan is for a Right Total Hip Replacement - Anterior Approach by Dr. Lequita Halt.  Plan is to go home.  PCP - Dr. Danise Edge - Patient has been seen preoperatviely and felt to be stable for surgery. [facility]  Signed electronically by Roberts Gaudy, PA-C

## 2012-04-04 ENCOUNTER — Encounter (HOSPITAL_COMMUNITY): Payer: Self-pay | Admitting: Pharmacy Technician

## 2012-04-07 ENCOUNTER — Other Ambulatory Visit (HOSPITAL_COMMUNITY): Payer: Self-pay | Admitting: *Deleted

## 2012-04-07 NOTE — Patient Instructions (Addendum)
20 YARIAH SELVEY  04/07/2012   Your procedure is scheduled on:  04/16/12  [facility]  Report to [facility] at    0600   AM.  Call this number if you have problems the morning of surgery: [phone]       Remember:   Do not eat food  Or drink :After Midnight. Tuesday NIGHT   Take these medicines the morning of surgery with A SIP OF WATER: NONE   .  Contacts, dentures or partial plates can not be worn to surgery  Leave suitcase in the car. After surgery it may be brought to your room.  For patients admitted to the hospital, checkout time is 11:00 AM day of  discharge.             SPECIAL INSTRUCTIONS- SEE [place] PREPARING FOR SURGERY INSTRUCTION SHEET-     DO NOT WEAR JEWELRY, LOTIONS, POWDERS, OR PERFUMES.  WOMEN-- DO NOT SHAVE LEGS OR UNDERARMS FOR 12 HOURS BEFORE SHOWERS. MEN MAY SHAVE FACE.  Patients discharged the day of surgery will not be allowed to drive home. IF going home the day of surgery, you must have a driver and someone to stay with you for the first 24 hours  Name and phone number of your driver:      admission                                                                  Please read over the following fact sheets that you were given: MRSA Information, Incentive Spirometry Sheet, Blood Transfusion Sheet  Information                                                                                   Usama Harkless  PST 336  [phone]                 FAILURE TO FOLLOW THESE INSTRUCTIONS MAY RESULT IN  CANCELLATION   OF YOUR SURGERY                                                  Patient Signature _____________________________

## 2012-04-07 NOTE — Progress Notes (Signed)
EKG 12/13 EPIC,   ABD CT 10/13 with clear lung bases EPIC

## 2012-04-08 ENCOUNTER — Encounter (HOSPITAL_COMMUNITY)
Admission: RE | Admit: 2012-04-08 | Discharge: 2012-04-08 | Disposition: A | Discharge status: Home / Self Care | Payer: BC Managed Care – PPO | Source: Ambulatory Visit | Attending: Orthopedic Surgery | Admitting: Orthopedic Surgery

## 2012-04-08 ENCOUNTER — Encounter (HOSPITAL_COMMUNITY): Payer: Self-pay

## 2012-04-08 ENCOUNTER — Ambulatory Visit (HOSPITAL_COMMUNITY)
Admission: RE | Admit: 2012-04-08 | Discharge: 2012-04-08 | Disposition: A | Discharge status: Home / Self Care | Payer: BC Managed Care – PPO | Source: Ambulatory Visit | Attending: Orthopedic Surgery | Admitting: Orthopedic Surgery

## 2012-04-08 DIAGNOSIS — Z01818 Encounter for other preprocedural examination: Secondary | ICD-10-CM | POA: Insufficient documentation

## 2012-04-08 DIAGNOSIS — Z01812 Encounter for preprocedural laboratory examination: Secondary | ICD-10-CM | POA: Insufficient documentation

## 2012-04-08 DIAGNOSIS — M1610 Unilateral primary osteoarthritis, unspecified hip: Secondary | ICD-10-CM | POA: Insufficient documentation

## 2012-04-08 DIAGNOSIS — M169 Osteoarthritis of hip, unspecified: Secondary | ICD-10-CM | POA: Insufficient documentation

## 2012-04-08 HISTORY — DX: Other specified postprocedural states: Z98.890

## 2012-04-08 HISTORY — DX: Nausea with vomiting, unspecified: R11.2

## 2012-04-08 HISTORY — DX: Gastro-esophageal reflux disease without esophagitis: K21.9

## 2012-04-08 HISTORY — DX: Personal history of other medical treatment: Z92.89

## 2012-04-08 LAB — URINALYSIS, ROUTINE W REFLEX MICROSCOPIC
Glucose, UA: NEGATIVE mg/dL
Hgb urine dipstick: NEGATIVE
Ketones, ur: NEGATIVE mg/dL
Leukocytes, UA: NEGATIVE
Protein, ur: NEGATIVE mg/dL
Urobilinogen, UA: 0.2 mg/dL (ref 0.0–1.0)

## 2012-04-08 LAB — COMPREHENSIVE METABOLIC PANEL
ALT: 12 U/L (ref 0–35)
BUN: 13 mg/dL (ref 6–23)
CO2: 31 mEq/L (ref 19–32)
Calcium: 9.8 mg/dL (ref 8.4–10.5)
Creatinine, Ser: 0.62 mg/dL (ref 0.50–1.10)
GFR calc Af Amer: >90 mL/min (ref >90)
GFR calc non Af Amer: >90 mL/min (ref >90)
Glucose, Bld: 62 mg/dL — ABNORMAL LOW (ref 70–99)
Sodium: 139 mEq/L (ref 135–145)
Total Protein: 7.1 g/dL (ref 6.0–8.3)

## 2012-04-08 LAB — CBC
HCT: 40.3 % (ref 36.0–46.0)
Hemoglobin: 13.3 g/dL (ref 12.0–15.0)
MCH: 30.4 pg (ref 26.0–34.0)
MCHC: 33 g/dL (ref 30.0–36.0)
MCV: 92 fL (ref 78.0–100.0)
RBC: 4.38 MIL/uL (ref 3.87–5.11)

## 2012-04-08 LAB — PROTIME-INR: Prothrombin Time: 12.8 seconds (ref 11.6–15.2)

## 2012-04-08 NOTE — Progress Notes (Signed)
Spoke with patient who stated she felt fine and had eaten breakfast before visit,  And has eaten lunch          Faxed result to Dr Aluisio/ [name] through [facility]

## 2012-04-09 NOTE — Progress Notes (Signed)
Received fax from Dr Lequita Halt-  No action re labs

## 2012-04-15 NOTE — Anesthesia Preprocedure Evaluation (Addendum)
Anesthesia Evaluation  Patient identified by MRN, date of birth, ID band Patient awake    Reviewed: Allergy & Precautions, H&P , NPO status , Patient's Chart, lab work & pertinent test results  History of Anesthesia Complications (+) PONV  Airway Mallampati: II TM Distance: >3 FB Neck ROM: Full    Dental  (+) Teeth Intact, Chipped and Dental Advisory Given,    Pulmonary neg pulmonary ROS,  breath sounds clear to auscultation  Pulmonary exam normal       Cardiovascular negative cardio ROS  Rhythm:Regular Rate:Normal     Neuro/Psych negative neurological ROS  negative psych ROS   GI/Hepatic Neg liver ROS, GERD-  Medicated,  Endo/Other  negative endocrine ROS  Renal/GU negative Renal ROS  negative genitourinary   Musculoskeletal  (+) Arthritis -,   Abdominal   Peds  Hematology negative hematology ROS (+)   Anesthesia Other Findings   Reproductive/Obstetrics negative OB ROS                          Anesthesia Physical Anesthesia Plan  ASA: II  Anesthesia Plan: General   Post-op Pain Management:    Induction: Intravenous  Airway Management Planned: Oral ETT  Additional Equipment:   Intra-op Plan:   Post-operative Plan: Extubation in OR  Informed Consent: I have reviewed the patients History and Physical, chart, labs and discussed the procedure including the risks, benefits and alternatives for the proposed anesthesia with the patient or authorized representative who has indicated his/her understanding and acceptance.   Dental advisory given  Plan Discussed with: CRNA  Anesthesia Plan Comments:        Anesthesia Quick Evaluation

## 2012-04-16 ENCOUNTER — Inpatient Hospital Stay (HOSPITAL_COMMUNITY): Payer: BC Managed Care – PPO

## 2012-04-16 ENCOUNTER — Encounter (HOSPITAL_COMMUNITY): Payer: Self-pay | Admitting: *Deleted

## 2012-04-16 ENCOUNTER — Encounter (HOSPITAL_COMMUNITY)
Admission: RE | Disposition: A | Discharge status: Home Health | Payer: Self-pay | Source: Ambulatory Visit | Attending: Orthopedic Surgery

## 2012-04-16 ENCOUNTER — Encounter (HOSPITAL_COMMUNITY): Payer: Self-pay | Admitting: Anesthesiology

## 2012-04-16 ENCOUNTER — Inpatient Hospital Stay (HOSPITAL_COMMUNITY): Payer: BC Managed Care – PPO | Admitting: Anesthesiology

## 2012-04-16 ENCOUNTER — Inpatient Hospital Stay (HOSPITAL_COMMUNITY)
Admission: RE | Admit: 2012-04-16 | Discharge: 2012-04-19 | DRG: 818 | Disposition: A | Discharge status: Home Health | Payer: BC Managed Care – PPO | Source: Ambulatory Visit | Attending: Orthopedic Surgery | Admitting: Orthopedic Surgery

## 2012-04-16 DIAGNOSIS — E559 Vitamin D deficiency, unspecified: Secondary | ICD-10-CM | POA: Diagnosis present

## 2012-04-16 DIAGNOSIS — M1610 Unilateral primary osteoarthritis, unspecified hip: Principal | ICD-10-CM | POA: Diagnosis present

## 2012-04-16 DIAGNOSIS — K219 Gastro-esophageal reflux disease without esophagitis: Secondary | ICD-10-CM | POA: Diagnosis present

## 2012-04-16 DIAGNOSIS — Z79899 Other long term (current) drug therapy: Secondary | ICD-10-CM

## 2012-04-16 DIAGNOSIS — Z6825 Body mass index (BMI) 25.0-25.9, adult: Secondary | ICD-10-CM

## 2012-04-16 DIAGNOSIS — M169 Osteoarthritis of hip, unspecified: Secondary | ICD-10-CM | POA: Diagnosis present

## 2012-04-16 DIAGNOSIS — E785 Hyperlipidemia, unspecified: Secondary | ICD-10-CM | POA: Diagnosis present

## 2012-04-16 DIAGNOSIS — D62 Acute posthemorrhagic anemia: Secondary | ICD-10-CM | POA: Diagnosis not present

## 2012-04-16 DIAGNOSIS — E663 Overweight: Secondary | ICD-10-CM | POA: Diagnosis present

## 2012-04-16 HISTORY — PX: TOTAL HIP ARTHROPLASTY: SHX124

## 2012-04-16 LAB — ABO/RH: ABO/RH(D): O POS

## 2012-04-16 LAB — TYPE AND SCREEN: Antibody Screen: NEGATIVE

## 2012-04-16 SURGERY — ARTHROPLASTY, HIP, TOTAL, ANTERIOR APPROACH
Anesthesia: General | Site: Hip | Laterality: Right | Wound class: Clean

## 2012-04-16 MED ORDER — ACETAMINOPHEN 650 MG RE SUPP: 650.0000 mg | Freq: Four times a day (QID) | RECTAL | Status: DC | PRN

## 2012-04-16 MED ORDER — BUPIVACAINE LIPOSOME 1.3 % IJ SUSP
INTRAMUSCULAR | Status: DC | PRN
  Administered 2012-04-16: 20 mL

## 2012-04-16 MED ORDER — DEXAMETHASONE SODIUM PHOSPHATE 10 MG/ML IJ SOLN: 10.0000 mg | Freq: Once | INTRAMUSCULAR | Status: AC

## 2012-04-16 MED ORDER — 0.9 % SODIUM CHLORIDE (POUR BTL) OPTIME
TOPICAL | Status: DC | PRN
  Administered 2012-04-16: 1000 mL

## 2012-04-16 MED ORDER — BUPIVACAINE LIPOSOME 1.3 % IJ SUSP
20.0000 mL | Freq: Once | INTRAMUSCULAR | Status: DC
  Filled 2012-04-16: qty 20

## 2012-04-16 MED ORDER — LACTATED RINGERS IV SOLN: INTRAVENOUS | Status: DC

## 2012-04-16 MED ORDER — FLEET ENEMA 7-19 GM/118ML RE ENEM: 1.0000 | ENEMA | Freq: Once | RECTAL | Status: AC | PRN

## 2012-04-16 MED ORDER — METOCLOPRAMIDE HCL 5 MG/ML IJ SOLN: 5.0000 mg | Freq: Three times a day (TID) | INTRAMUSCULAR | Status: DC | PRN

## 2012-04-16 MED ORDER — ONDANSETRON HCL 4 MG/2ML IJ SOLN
INTRAMUSCULAR | Status: DC | PRN
  Administered 2012-04-16: 4 mg via INTRAVENOUS

## 2012-04-16 MED ORDER — PROMETHAZINE HCL 25 MG PO TABS: 12.5000 mg | ORAL_TABLET | Freq: Four times a day (QID) | ORAL | Status: DC | PRN

## 2012-04-16 MED ORDER — RIVAROXABAN 10 MG PO TABS
10.0000 mg | ORAL_TABLET | Freq: Every day | ORAL | Status: DC
  Administered 2012-04-17 – 2012-04-19 (×3): 10 mg via ORAL
  Filled 2012-04-16 (×4): qty 1

## 2012-04-16 MED ORDER — SODIUM CHLORIDE 0.9 % IV SOLN: INTRAVENOUS | Status: DC

## 2012-04-16 MED ORDER — ONDANSETRON HCL 4 MG/2ML IJ SOLN: 4.0000 mg | Freq: Four times a day (QID) | INTRAMUSCULAR | Status: DC | PRN

## 2012-04-16 MED ORDER — LACTATED RINGERS IV SOLN
INTRAVENOUS | Status: DC | PRN
  Administered 2012-04-16 (×4): via INTRAVENOUS

## 2012-04-16 MED ORDER — MIDAZOLAM HCL 5 MG/5ML IJ SOLN
INTRAMUSCULAR | Status: DC | PRN
  Administered 2012-04-16: 2 mg via INTRAVENOUS

## 2012-04-16 MED ORDER — MORPHINE SULFATE 2 MG/ML IJ SOLN
1.0000 mg | INTRAMUSCULAR | Status: DC | PRN
  Administered 2012-04-16 – 2012-04-17 (×2): 2 mg via INTRAVENOUS
  Filled 2012-04-16 (×2): qty 1

## 2012-04-16 MED ORDER — POLYETHYLENE GLYCOL 3350 17 G PO PACK: 17.0000 g | PACK | Freq: Every day | ORAL | Status: DC | PRN

## 2012-04-16 MED ORDER — METHOCARBAMOL 500 MG PO TABS
500.0000 mg | ORAL_TABLET | Freq: Four times a day (QID) | ORAL | Status: DC | PRN
  Administered 2012-04-17 – 2012-04-19 (×3): 500 mg via ORAL
  Filled 2012-04-16 (×5): qty 1

## 2012-04-16 MED ORDER — HYDROMORPHONE HCL PF 1 MG/ML IJ SOLN
0.2500 mg | INTRAMUSCULAR | Status: DC | PRN
  Administered 2012-04-16 (×4): 0.5 mg via INTRAVENOUS

## 2012-04-16 MED ORDER — PROMETHAZINE HCL 25 MG/ML IJ SOLN
12.5000 mg | Freq: Four times a day (QID) | INTRAMUSCULAR | Status: DC | PRN
  Administered 2012-04-16: 12.5 mg via INTRAVENOUS
  Filled 2012-04-16: qty 1

## 2012-04-16 MED ORDER — GLYCOPYRROLATE 0.2 MG/ML IJ SOLN
INTRAMUSCULAR | Status: DC | PRN
  Administered 2012-04-16: 0.6 mg via INTRAVENOUS

## 2012-04-16 MED ORDER — METOCLOPRAMIDE HCL 10 MG PO TABS: 5.0000 mg | ORAL_TABLET | Freq: Three times a day (TID) | ORAL | Status: DC | PRN

## 2012-04-16 MED ORDER — DEXAMETHASONE SODIUM PHOSPHATE 10 MG/ML IJ SOLN
INTRAMUSCULAR | Status: DC | PRN
  Administered 2012-04-16: 10 mg via INTRAVENOUS

## 2012-04-16 MED ORDER — DIPHENHYDRAMINE HCL 12.5 MG/5ML PO ELIX: 12.5000 mg | ORAL_SOLUTION | ORAL | Status: DC | PRN

## 2012-04-16 MED ORDER — METHOCARBAMOL 100 MG/ML IJ SOLN
500.0000 mg | Freq: Four times a day (QID) | INTRAVENOUS | Status: DC | PRN
  Administered 2012-04-16: 500 mg via INTRAVENOUS
  Filled 2012-04-16: qty 5

## 2012-04-16 MED ORDER — CEFAZOLIN SODIUM 1-5 GM-% IV SOLN
1.0000 g | Freq: Four times a day (QID) | INTRAVENOUS | Status: AC
  Administered 2012-04-16 (×2): 1 g via INTRAVENOUS
  Filled 2012-04-16 (×2): qty 50

## 2012-04-16 MED ORDER — MENTHOL 3 MG MT LOZG
1.0000 | LOZENGE | OROMUCOSAL | Status: DC | PRN
  Administered 2012-04-17: 3 mg via ORAL
  Filled 2012-04-16: qty 9

## 2012-04-16 MED ORDER — PHENOL 1.4 % MT LIQD: 1.0000 | OROMUCOSAL | Status: DC | PRN

## 2012-04-16 MED ORDER — BISACODYL 10 MG RE SUPP: 10.0000 mg | Freq: Every day | RECTAL | Status: DC | PRN

## 2012-04-16 MED ORDER — PROMETHAZINE HCL 25 MG RE SUPP: 12.5000 mg | Freq: Four times a day (QID) | RECTAL | Status: DC | PRN

## 2012-04-16 MED ORDER — DOCUSATE SODIUM 100 MG PO CAPS
100.0000 mg | ORAL_CAPSULE | Freq: Two times a day (BID) | ORAL | Status: DC
  Administered 2012-04-16 – 2012-04-19 (×6): 100 mg via ORAL

## 2012-04-16 MED ORDER — SODIUM CHLORIDE 0.9 % IJ SOLN
INTRAMUSCULAR | Status: DC | PRN
  Administered 2012-04-16: 50 mL

## 2012-04-16 MED ORDER — EPHEDRINE SULFATE 50 MG/ML IJ SOLN
INTRAMUSCULAR | Status: DC | PRN
  Administered 2012-04-16: 10 mg via INTRAVENOUS
  Administered 2012-04-16: 5 mg via INTRAVENOUS

## 2012-04-16 MED ORDER — FAMOTIDINE 20 MG PO TABS
20.0000 mg | ORAL_TABLET | Freq: Two times a day (BID) | ORAL | Status: DC
  Administered 2012-04-16 – 2012-04-19 (×6): 20 mg via ORAL
  Filled 2012-04-16 (×8): qty 1

## 2012-04-16 MED ORDER — ACETAMINOPHEN 325 MG PO TABS: 650.0000 mg | ORAL_TABLET | Freq: Four times a day (QID) | ORAL | Status: DC | PRN

## 2012-04-16 MED ORDER — ACETAMINOPHEN 10 MG/ML IV SOLN
1000.0000 mg | Freq: Four times a day (QID) | INTRAVENOUS | Status: AC
  Administered 2012-04-16 – 2012-04-17 (×4): 1000 mg via INTRAVENOUS
  Filled 2012-04-16 (×6): qty 100

## 2012-04-16 MED ORDER — ROCURONIUM BROMIDE 100 MG/10ML IV SOLN
INTRAVENOUS | Status: DC | PRN
  Administered 2012-04-16: 15 mg via INTRAVENOUS
  Administered 2012-04-16: 30 mg via INTRAVENOUS

## 2012-04-16 MED ORDER — FENTANYL CITRATE 0.05 MG/ML IJ SOLN
INTRAMUSCULAR | Status: DC | PRN
  Administered 2012-04-16 (×3): 50 ug via INTRAVENOUS
  Administered 2012-04-16: 100 ug via INTRAVENOUS

## 2012-04-16 MED ORDER — HYDROMORPHONE HCL 2 MG PO TABS
2.0000 mg | ORAL_TABLET | ORAL | Status: DC | PRN
  Administered 2012-04-16 – 2012-04-18 (×8): 2 mg via ORAL
  Administered 2012-04-18: 4 mg via ORAL
  Administered 2012-04-18 – 2012-04-19 (×3): 2 mg via ORAL
  Filled 2012-04-16 (×10): qty 1
  Filled 2012-04-16: qty 2
  Filled 2012-04-16 (×2): qty 1

## 2012-04-16 MED ORDER — ACETAMINOPHEN 10 MG/ML IV SOLN
1000.0000 mg | Freq: Once | INTRAVENOUS | Status: AC
  Administered 2012-04-16: 1000 mg via INTRAVENOUS

## 2012-04-16 MED ORDER — PROMETHAZINE HCL 25 MG/ML IJ SOLN: 6.2500 mg | INTRAMUSCULAR | Status: DC | PRN

## 2012-04-16 MED ORDER — CEFAZOLIN SODIUM-DEXTROSE 2-3 GM-% IV SOLR
2.0000 g | INTRAVENOUS | Status: AC
  Administered 2012-04-16: 2 g via INTRAVENOUS

## 2012-04-16 MED ORDER — NEOSTIGMINE METHYLSULFATE 1 MG/ML IJ SOLN
INTRAMUSCULAR | Status: DC | PRN
  Administered 2012-04-16: 4 mg via INTRAVENOUS

## 2012-04-16 MED ORDER — DEXTROSE-NACL 5-0.9 % IV SOLN
INTRAVENOUS | Status: DC
  Administered 2012-04-16 – 2012-04-17 (×2): via INTRAVENOUS

## 2012-04-16 MED ORDER — DEXAMETHASONE 6 MG PO TABS
10.0000 mg | ORAL_TABLET | Freq: Once | ORAL | Status: AC
  Administered 2012-04-17: 10 mg via ORAL
  Filled 2012-04-16: qty 1

## 2012-04-16 MED ORDER — SUCCINYLCHOLINE CHLORIDE 20 MG/ML IJ SOLN
INTRAMUSCULAR | Status: DC | PRN
  Administered 2012-04-16: 100 mg via INTRAVENOUS

## 2012-04-16 MED ORDER — ONDANSETRON HCL 4 MG PO TABS: 4.0000 mg | ORAL_TABLET | Freq: Four times a day (QID) | ORAL | Status: DC | PRN

## 2012-04-16 MED ORDER — TRAMADOL HCL 50 MG PO TABS: 50.0000 mg | ORAL_TABLET | Freq: Four times a day (QID) | ORAL | Status: DC | PRN

## 2012-04-16 MED ORDER — PROPOFOL 10 MG/ML IV BOLUS
INTRAVENOUS | Status: DC | PRN
  Administered 2012-04-16: 160 mg via INTRAVENOUS

## 2012-04-16 SURGICAL SUPPLY — 41 items
BAG SPEC THK2 15X12 ZIP CLS (MISCELLANEOUS) ×1
BAG ZIPLOCK 12X15 (MISCELLANEOUS) ×3 IMPLANT
BLADE SAW SGTL 18X1.27X75 (BLADE) ×2 IMPLANT
CLOTH BEACON ORANGE TIMEOUT ST (SAFETY) ×2 IMPLANT
CLSR STERI-STRIP ANTIMIC 1/2X4 (GAUZE/BANDAGES/DRESSINGS) ×4 IMPLANT
DECANTER SPIKE VIAL GLASS SM (MISCELLANEOUS) ×2 IMPLANT
DRAPE C-ARM 42X72 X-RAY (DRAPES) ×2 IMPLANT
DRAPE STERI IOBAN 125X83 (DRAPES) ×2 IMPLANT
DRAPE U-SHAPE 47X51 STRL (DRAPES) ×6 IMPLANT
DRSG ADAPTIC 3X8 NADH LF (GAUZE/BANDAGES/DRESSINGS) ×2 IMPLANT
DRSG MEPILEX BORDER 4X4 (GAUZE/BANDAGES/DRESSINGS) ×1 IMPLANT
DRSG MEPILEX BORDER 4X8 (GAUZE/BANDAGES/DRESSINGS) ×2 IMPLANT
DURAPREP 26ML APPLICATOR (WOUND CARE) ×2 IMPLANT
ELECT BLADE 6.5 EXT (BLADE) ×2 IMPLANT
ELECT REM PT RETURN 9FT ADLT (ELECTROSURGICAL) ×2
ELECTRODE REM PT RTRN 9FT ADLT (ELECTROSURGICAL) ×1 IMPLANT
EVACUATOR 1/8 PVC DRAIN (DRAIN) ×1 IMPLANT
FACESHIELD LNG OPTICON STERILE (SAFETY) ×8 IMPLANT
GLOVE BIO SURGEON STRL SZ8 (GLOVE) ×3 IMPLANT
GLOVE BIOGEL PI IND STRL 8 (GLOVE) ×1 IMPLANT
GLOVE BIOGEL PI INDICATOR 8 (GLOVE) ×2
GLOVE ECLIPSE 8.0 STRL XLNG CF (GLOVE) ×2 IMPLANT
GOWN BRE IMP PREV XXLGXLNG (GOWN DISPOSABLE) ×4 IMPLANT
GOWN STRL NON-REIN LRG LVL3 (GOWN DISPOSABLE) ×2 IMPLANT
KIT BASIN OR (CUSTOM PROCEDURE TRAY) ×2 IMPLANT
NDL SAFETY ECLIPSE 18X1.5 (NEEDLE) ×1 IMPLANT
NEEDLE HYPO 18GX1.5 SHARP (NEEDLE) ×2
PACK TOTAL JOINT (CUSTOM PROCEDURE TRAY) ×2 IMPLANT
PADDING CAST COTTON 6X4 STRL (CAST SUPPLIES) ×2 IMPLANT
SPONGE GAUZE 4X4 12PLY (GAUZE/BANDAGES/DRESSINGS) ×2 IMPLANT
SUCTION FRAZIER 12FR DISP (SUCTIONS) ×2 IMPLANT
SUT ETHIBOND NAB CT1 #1 30IN (SUTURE) ×5 IMPLANT
SUT MNCRL AB 4-0 PS2 18 (SUTURE) ×2 IMPLANT
SUT VIC AB 1 CT1 27 (SUTURE) ×2
SUT VIC AB 1 CT1 27XBRD ANTBC (SUTURE) ×1 IMPLANT
SUT VIC AB 2-0 CT1 27 (SUTURE) ×4
SUT VIC AB 2-0 CT1 TAPERPNT 27 (SUTURE) ×2 IMPLANT
SUT VLOC 180 0 24IN GS25 (SUTURE) ×2 IMPLANT
SYR 50ML LL SCALE MARK (SYRINGE) ×2 IMPLANT
TOWEL OR 17X26 10 PK STRL BLUE (TOWEL DISPOSABLE) ×4 IMPLANT
TRAY FOLEY CATH 14FRSI W/METER (CATHETERS) ×2 IMPLANT

## 2012-04-16 NOTE — Transfer of Care (Signed)
Immediate Anesthesia Transfer of Care Note  Patient: Michelle Romero  Procedure(s) Performed: Procedure(s) (LRB) with comments: TOTAL HIP ARTHROPLASTY ANTERIOR APPROACH (Right)  Patient Location: PACU  Anesthesia Type:General  Level of Consciousness: awake, sedated and patient cooperative  Airway & Oxygen Therapy: Patient Spontanous Breathing and Patient connected to face mask oxygen  Post-op Assessment: Report given to PACU RN and Post -op Vital signs reviewed and stable  Post vital signs: Reviewed and stable  Complications: No apparent anesthesia complications

## 2012-04-16 NOTE — H&P (View-Only) (Signed)
Michelle Romero  DOB: 07/11/1955 Married / Language: English / Race: White Female  Date of Admission:  04/16/2012  Chief Complaint:  Right Hip Pain  History of Present Illness The patient is a 57 year old female who comes in for a preoperative History and Physical. The patient is scheduled for a right total hip arthroplasty (Anterior Approach) to be performed by Dr. Frank V. Aluisio, MD at [facility] on 04/16/2012. The patient is a 57 year old female who presents with a hip problem. The patient was seen in referral from Dr. Blythe.The patient reports right hip problems including pain, giving way and stiffness symptoms that have been present for 20 year(s). The symptoms began without any known injury.The patient feels as if their symptoms are does feel they are worsening. Current treatment includes nonsteroidal anti-inflammatory drugs (aspirin, along with Tylenol prn). Prior to being seen today the patient was previously evaluated by a colleague (Dr. Caffrey at [facility]). Previous workup for this problem has included hip x-rays. Previous treatment for this problem has included corticosteroid injection (these have been done by Dr. Newton at [facility]. She states that he has also discuss possible injections in her hip) and physical therapy. She states that the hip is getting progressively worse. The injections helped for only a short amount of time. She is having worsening stiffness in the hip also. She is into horseback riding and has horses. She is having a more difficult time doing so because of the very limited movement of her hip. She is not having problems currently with her left hip. She is not having any lower extremity weakness or paresthesia. She occasionally gets knee pain radiating from the hip. She is ready to get the hip fixed. They have been treated conservatively in the past for the above stated problem and despite conservative measures, they continue to  have progressive pain and severe functional limitations and dysfunction. They have failed non-operative management including home exercise, medications, and injections. It is felt that they would benefit from undergoing total joint replacement. Risks and benefits of the procedure have been discussed with the patient and they elect to proceed with surgery. There are no active contraindications to surgery such as ongoing infection or rapidly progressive neurological disease.   Problem List Osteoarthritis, Hip (715.35)   Allergies Mobic *ANALGESICS - ANTI-INFLAMMATORY*. Dizziness, Diarrhea. Chest Pain   Family History Cerebrovascular Accident. mother Diabetes Mellitus. mother Hypertension. mother Osteoarthritis. father Rheumatoid Arthritis. father   Social History Exercise. Exercises daily; does running / walking and other Illicit drug use. no Living situation. live with spouse Current work status. seeking work Drug/Alcohol Rehab (Currently). no Drug/Alcohol Rehab (Previously). no Tobacco use. never smoker Marital status. married Number of flights of stairs before winded. 2-3 Pain Contract. no Alcohol use. current drinker; drinks wine; only occasionally per week Children. 2 Post-Surgical Plans. Plan is to go home. Advance Directives. Living Will, Healthcare POA   Medication History Aspirin EC (81MG Tablet DR, 4 Oral daily) Active. Glucosamine Chondroitin Complx ( Oral) Active. Methylsulfonylmethane (1000MG Tablet, Oral) Active. Cholecalciferol (2000UNIT Tablet, Oral) Active. Zantac (150MG Tablet, Oral) Active. Trace Minerals (Cr-Zn) (0.2-15MG Tablet, Oral) Active. Magnesium Oxide (500MG Tablet, Oral) Active.   Pregnancy / Birth History Pregnant. no   Past Surgical History Tonsillectomy Dilation and Curettage of Uterus Wrist Surgery. Date: 2008.   Medical History Varicose veins Gastroesophageal Reflux Disease Menopause Vitamin D  Deficiency Hyperlipidemia   Review of Systems General:Not Present- Chills, Fever, Night Sweats, Fatigue, Weight Gain, Weight   Loss and Memory Loss. Skin:Not Present- Hives, Itching, Rash, Eczema and Lesions. HEENT:Not Present- Tinnitus, Headache, Double Vision, Visual Loss, Hearing Loss and Dentures. Respiratory:Not Present- Shortness of breath with exertion, Shortness of breath at rest, Allergies, Coughing up blood and Chronic Cough. Cardiovascular:Not Present- Chest Pain, Racing/skipping heartbeats, Difficulty Breathing Lying Down, Murmur, Swelling and Palpitations. Gastrointestinal:Present- Constipation. Not Present- Bloody Stool, Heartburn, Abdominal Pain, Vomiting, Nausea, Diarrhea, Difficulty Swallowing, Jaundice and Loss of appetitie. Female Genitourinary:Not Present- Blood in Urine, Urinary frequency, Weak urinary stream, Discharge, Flank Pain, Incontinence, Painful Urination, Urgency, Urinary Retention and Urinating at Night. Musculoskeletal:Present- Morning Stiffness. Not Present- Muscle Weakness, Muscle Pain, Joint Swelling, Joint Pain, Back Pain and Spasms. Neurological:Not Present- Tremor, Dizziness, Blackout spells, Paralysis, Difficulty with balance and Weakness. Psychiatric:Not Present- Insomnia.   Vitals Weight: 186 lb Height: 71 in Body Surface Area: 2.06 m Body Mass Index: 25.94 kg/m Pulse: 64 (Regular) Resp.: 12 (Unlabored) BP: 144/88 (Sitting, Left Arm, Standard)    Physical Exam The physical exam findings are as follows:  Note: Patient is a 57 year old female with continued hip pain.   General Mental Status - Alert, cooperative and good historian. General Appearance- pleasant. Not in acute distress. Orientation- Oriented X3. Build & Nutrition- Well nourished and Well developed.   Head and Neck Head- normocephalic, atraumatic . Neck Global Assessment- supple. no bruit auscultated on the right and no bruit auscultated on  the left.   Eye Pupil- Bilateral- Regular and Round. Motion- Bilateral- EOMI. wears glasses  Chest and Lung Exam Auscultation: Breath sounds:- clear at anterior chest wall and - clear at posterior chest wall. Adventitious sounds:- No Adventitious sounds.   Cardiovascular Auscultation:Rhythm- Regular rate and rhythm. Heart Sounds- S1 WNL and S2 WNL. Murmurs & Other Heart Sounds:Auscultation of the heart reveals - No Murmurs.   Abdomen Palpation/Percussion:Tenderness- Abdomen is non-tender to palpation. Rigidity (guarding)- Abdomen is soft. Auscultation:Auscultation of the abdomen reveals - Bowel sounds normal.   Female Genitourinary Not done, not pertinent to present illness  Musculoskeletal Well developed female, alert and oriented in no apparent distress. The left hip has normal range of motion without discomfort. Her right hip can be flexed to 95. No internal or external rotation, only about 10-15 degrees of abduction. Her knee exam is normal on both sides. Pulse, sensation, and motor are intact distally.  RADIOGRAPHS: AP pelvis, AP and lateral of the right hip show that she has bone on bone arthritis with some erosion of the femoral head. She also has arthritic change on the left hip, but no where near as bad as the right.  Assessment & Plan Osteoarthritis, Hip (715.35) Impression: Right Hip  Note: Plan is for a Right Total Hip Replacement - Anterior Approach by Dr. Aluisio.  Plan is to go home.  PCP - Dr. Stacey Blyth - Patient has been seen preoperatviely and felt to be stable for surgery. [facility] [place]  Signed electronically by DREW L PERKINS, PA-C 

## 2012-04-16 NOTE — Anesthesia Postprocedure Evaluation (Signed)
Anesthesia Post Note  Patient: Michelle Romero  Procedure(s) Performed: Procedure(s) (LRB): TOTAL HIP ARTHROPLASTY ANTERIOR APPROACH (Right)  Anesthesia type: General  Patient location: PACU  Post pain: Pain level controlled  Post assessment: Post-op Vital signs reviewed  Last Vitals:  Filed Vitals:   04/16/12 1300  BP:   Pulse:   Temp: 36.8 C  Resp:     Post vital signs: Reviewed  Level of consciousness: sedated  Complications: No apparent anesthesia complications

## 2012-04-16 NOTE — Interval H&P Note (Signed)
History and Physical Interval Note:  04/16/2012 8:13 AM  Michelle Romero  has presented today for surgery, with the diagnosis of Osteoarthritis of the Right Hip  The various methods of treatment have been discussed with the patient and family. After consideration of risks, benefits and other options for treatment, the patient has consented to  Procedure(s) (LRB) with comments: TOTAL HIP ARTHROPLASTY ANTERIOR APPROACH (Right) as a surgical intervention .  The patient's history has been reviewed, patient examined, no change in status, stable for surgery.  I have reviewed the patient's chart and labs.  Questions were answered to the patient's satisfaction.     Loanne Drilling

## 2012-04-16 NOTE — Op Note (Signed)
OPERATIVE REPORT  PREOPERATIVE DIAGNOSIS: Osteoarthritis of the Right hip.   POSTOPERATIVE DIAGNOSIS: Osteoarthritis of the Right  hip.   PROCEDURE: Right total hip arthroplasty, anterior approach.   SURGEON: Ollen Gross, MD   ASSISTANT: drew Greggory Brandy  ANESTHESIA:  General  ESTIMATED BLOOD LOSS:-700 ml  DRAINS: Hemovac x1.   COMPLICATIONS: None   CONDITION: PACU - hemodynamically stable.   BRIEF CLINICAL NOTE: Michelle Romero is a 57 y.o. female who has advanced end-  stage arthritis of his Right  hip with progressively worsening pain and  dysfunction.The patient has failed nonoperative management and presents for  total hip arthroplasty. She has bone on bone arthritis with massive osteophyte formation.  PROCEDURE IN DETAIL: After successful administration of spinal  anesthetic, the traction boots for the [facility] bed were placed on both  feet and the patient was placed onto the [facility] bed, boots placed into the leg  holders. The Right hip was then isolated from the perineum with plastic  drapes and prepped and draped in the usual sterile fashion. ASIS and  greater trochanter were marked and a oblique incision was made, starting  at about 1 cm lateral and 2 cm distal to the ASIS and coursing towards  the anterior cortex of the femur. The skin was cut with a 10 blade  through subcutaneous tissue to the level of the fascia overlying the  tensor fascia lata muscle. The fascia was then incised in line with the  incision at the junction of the anterior third and posterior 2/3rd. The  muscle was teased off the fascia and then the interval between the TFL  and the rectus was developed. The Hohmann retractor was then placed at  the top of the femoral neck over the capsule. The vessels overlying the  capsule were cauterized and the fat on top of the capsule was removed.  A Hohmann retractor was then placed anterior underneath the rectus  femoris to give exposure to the  entire anterior capsule. A T-shaped  capsulotomy was performed. The edges were tagged and the femoral head  was identified.       Osteophytes are removed off the superior acetabulum.  The femoral neck was then cut in situ with an oscillating saw. Traction  was then applied to the left lower extremity utilizing the [facility]  traction. The femoral head was then removed. Retractors were placed  around the acetabulum and then circumferential removal of the labrum was  performed. Osteophytes were also removed. Reaming starts at 45 mm to  medialize and  Increased in 2 mm increments to 51 mm. We reamed in  approximately 40 degrees of abduction, 20 degrees anteversion. A 52 mm  pinnacle acetabular shell was then impacted in anatomic position under  fluoroscopic guidance with excellent purchase. We did not need to place  any additional dome screws. A 32 mm neutral + 4 marathon liner was then  placed into the acetabular shell.       The femoral lift was then placed along the lateral aspect of the femur  just distal to the vastus ridge. The leg was  externally rotated and capsule  was stripped off the inferior aspect of the femoral neck down to the  level of the lesser trochanter, this was done with electrocautery. The femur was lifted after this was performed. The  leg was then placed and extended in adducted position to essentially delivering the femur. We also removed the capsule  superiorly and the  piriformis from the piriformis fossa to gain excellent exposure of the  proximal femur. Rongeur was used to remove some cancellous bone to get  into the lateral portion of the proximal femur for placement of the  initial starter reamer. The starter broaches was placed  the starter broach  and was shown to go down the center of the canal. Broaching  with the  Corail system was then performed starting at size 8, coursing  Up to size 13. A size 13 had excellent torsional and rotational  and axial stability.  The trial standard offset neck was then placed  with a 32 + 5 trial head. The hip was then reduced. We confirmed that  the stem was in the canal both on AP and lateral x-rays. It also has excellent sizing. The hip was reduced with outstanding stability through full extension, full external rotation,  and then flexion in adduction internal rotation. AP pelvis was taken  and the leg lengths were measured and found to be exactly equal. Hip  was then dislocated again and the femoral head and neck removed. The  femoral broach was removed. Size 13 Corail stem with a standard offset  neck was then impacted into the femur following native anteversion. Has  excellent purchase in the canal. Excellent torsional and rotational and  axial stability. It is confirmed to be in the canal on AP and lateral  fluoroscopic views. The 32 mm + 5 ceramic head was placed and the hip  reduced with outstanding stability. Again AP pelvis was taken and it  confirmed that the leg lengths were equal. The wound was then copiously  irrigated with saline solution and the capsule reattached and repaired  with Ethibond suture.  20 mL of Exparel mixed with 50 mL of saline injected  into the capsule and into the edge of the tensor fascia lata as well as  subcutaneous tissue. The fascia overlying the tensor fascia lata was  then closed with a running #1 V-Loc. Subcu was closed with interrupted  2-0 Vicryl and subcuticular running 4-0 Monocryl. Incision was cleaned  and dried. Steri-Strips and a bulky sterile dressing applied. Hemovac  drain was hooked to suction and then he was awakened and transported to  recovery in stable condition.        Please note that a surgical assistant was a medical necessity for this procedure to perform it in a safe and expeditious manner. Assistant was necessary to provide appropriate retraction of vital neurovascular structures and to prevent femoral fracture and allow for anatomic placement of the  prosthesis.  Ollen Gross, M.D.

## 2012-04-16 NOTE — Evaluation (Signed)
Physical Therapy Evaluation Patient Details Name: Michelle Romero MRN: [id number] DOB: 03-12-1956 Today's Date: 04/16/2012 Time: 3086-5784 PT Time Calculation (min): 43 min  PT Assessment / Plan / Recommendation Clinical Impression  Pt s/p R THR presents with decreased R LE strength/ROM and post op pain and nausea limiting functional mobility    PT Assessment  Patient needs continued PT services    Follow Up Recommendations  Home health PT;SNF (dependent on acute stay progress)    Does the patient have the potential to tolerate intense rehabilitation      Barriers to Discharge Decreased caregiver support      Equipment Recommendations  Rolling walker with 5" wheels    Recommendations for Other Services OT consult   Frequency 7X/week    Precautions / Restrictions Precautions Precautions: Fall Restrictions Weight Bearing Restrictions: No Other Position/Activity Restrictions: WBAT   Pertinent Vitals/Pain 3/10; premedicated      Mobility  Bed Mobility Bed Mobility: Supine to Sit;Sit to Supine Supine to Sit: 4: Min assist Sit to Supine: 4: Min assist Details for Bed Mobility Assistance: cues for sequence and use of L LE to self assist Transfers Transfers: Sit to Stand;Stand to Sit Sit to Stand: 4: Min assist Stand to Sit: 4: Min assist Details for Transfer Assistance: cues for use of UEs to self assist and for LE management Ambulation/Gait Ambulation/Gait Assistance: 4: Min assist Ambulation Distance (Feet): 4 Feet Assistive device: Rolling walker Ambulation/Gait Assistance Details: pt side stepped to top of bed with RW and min assist General Gait Details: Ltd by nausea Stairs: No    Shoulder Instructions     Exercises Total Joint Exercises Ankle Circles/Pumps: AROM;10 reps;Supine;Both Quad Sets: AROM;10 reps;Both;Supine Heel Slides: AAROM;10 reps;Supine;Right Hip ABduction/ADduction: AAROM;Right;10 reps;Supine   PT Diagnosis: Difficulty walking  PT  Problem List: Decreased strength;Decreased range of motion;Decreased activity tolerance;Decreased mobility;Decreased knowledge of use of DME;Pain PT Treatment Interventions: DME instruction;Gait training;Functional mobility training;Therapeutic activities;Therapeutic exercise;Patient/family education   PT Goals Acute Rehab PT Goals PT Goal Formulation: With patient Time For Goal Achievement: 04/22/12 Potential to Achieve Goals: Good Pt will go Supine/Side to Sit: with supervision PT Goal: Supine/Side to Sit - Progress: Goal set today Pt will go Sit to Supine/Side: with supervision PT Goal: Sit to Supine/Side - Progress: Goal set today Pt will go Sit to Stand: with supervision PT Goal: Sit to Stand - Progress: Goal set today Pt will go Stand to Sit: with supervision PT Goal: Stand to Sit - Progress: Goal set today Pt will Ambulate: 51 - 150 feet;with supervision;with rolling walker PT Goal: Ambulate - Progress: Goal set today  Visit Information  Last PT Received On: 04/16/12 Assistance Needed: +2    Subjective Data  Subjective: I'm glad you're here.  I need to move my legs Patient Stated Goal: Resume previous lifestyle with decreased pain   Prior Functioning  Home Living Lives With: Spouse Available Help at Discharge: Family Type of Home: House Home Access: Ramped entrance Home Layout: One level Additional Comments: Pt's spouse with hx of CVA is unable to assist pt  Prior Function Level of Independence: Independent Able to Take Stairs?: Yes Driving: Yes Communication Communication: No difficulties Dominant Hand: Right    Cognition  Overall Cognitive Status: Appears within functional limits for tasks assessed/performed Arousal/Alertness: Awake/alert Orientation Level: Appears intact for tasks assessed Behavior During Session: [facility] for tasks performed    Extremity/Trunk Assessment Right Upper Extremity Assessment RUE ROM/Strength/Tone: [facility] for tasks assessed Left Upper  Extremity Assessment LUE ROM/Strength/Tone:  WFL for tasks assessed Right Lower Extremity Assessment RLE ROM/Strength/Tone: Deficits RLE ROM/Strength/Tone Deficits: hip strength 3-/5 with AAROM at hip to 80 flex and 20 abd Left Lower Extremity Assessment LLE ROM/Strength/Tone: WFL for tasks assessed   Balance    End of Session PT - End of Session Equipment Utilized During Treatment: Gait belt Activity Tolerance: Patient limited by fatigue;Other (comment) (and nausea) Patient left: in bed;with call bell/phone within reach Nurse Communication: Mobility status  GP     Michelle Romero 04/16/2012, 5:03 PM

## 2012-04-17 ENCOUNTER — Encounter (HOSPITAL_COMMUNITY): Payer: Self-pay | Admitting: Orthopedic Surgery

## 2012-04-17 DIAGNOSIS — D62 Acute posthemorrhagic anemia: Secondary | ICD-10-CM | POA: Diagnosis not present

## 2012-04-17 LAB — BASIC METABOLIC PANEL
BUN: 9 mg/dL (ref 6–23)
CO2: 28 mEq/L (ref 19–32)
Calcium: 8.7 mg/dL (ref 8.4–10.5)
GFR calc non Af Amer: >90 mL/min (ref >90)
Glucose, Bld: 122 mg/dL — ABNORMAL HIGH (ref 70–99)

## 2012-04-17 LAB — CBC
HCT: 29.1 % — ABNORMAL LOW (ref 36.0–46.0)
Hemoglobin: 9.7 g/dL — ABNORMAL LOW (ref 12.0–15.0)
MCH: 30.6 pg (ref 26.0–34.0)
MCHC: 33.3 g/dL (ref 30.0–36.0)

## 2012-04-17 MED FILL — Ciprofloxacin 400 MG/200ML in D5W: INTRAVENOUS | Qty: 200 | Status: AC

## 2012-04-17 NOTE — Progress Notes (Signed)
Physical Therapy Treatment Patient Details Name: TERIANA DANKER MRN: [id number] DOB: April 10, 1955 Today's Date: 04/17/2012 Time: 9629-5284 PT Time Calculation (min): 35 min  PT Assessment / Plan / Recommendation Comments on Treatment Session       Follow Up Recommendations  Home health PT;SNF     Does the patient have the potential to tolerate intense rehabilitation     Barriers to Discharge        Equipment Recommendations  Rolling walker with 5" wheels    Recommendations for Other Services OT consult  Frequency 7X/week   Plan Discharge plan remains appropriate    Precautions / Restrictions Precautions Precautions: Fall Restrictions Weight Bearing Restrictions: No Other Position/Activity Restrictions: WBAT   Pertinent Vitals/Pain 4/10 with WB, min pain at rest, premedicated    Mobility  Bed Mobility Bed Mobility: Supine to Sit Supine to Sit: 4: Min assist Details for Bed Mobility Assistance: cues for sequence and use of L LE to self assist Transfers Transfers: Sit to Stand;Stand to Sit Sit to Stand: 4: Min assist Stand to Sit: 4: Min assist Details for Transfer Assistance: cues for use of UEs to self assist and for LE management Ambulation/Gait Ambulation/Gait Assistance: 4: Min assist Ambulation Distance (Feet): 147 Feet Assistive device: Rolling walker Ambulation/Gait Assistance Details: cues for posture, sequence, position from RW, and ER on R Gait Pattern: Step-to pattern Stairs: No    Exercises Total Joint Exercises Ankle Circles/Pumps: AROM;Supine;Both;20 reps Quad Sets: AROM;10 reps;Both;Supine Gluteal Sets: AROM;10 reps;Supine;Both Heel Slides: AAROM;Supine;Right;20 reps Hip ABduction/ADduction: AAROM;Right;Supine;20 reps   PT Diagnosis:    PT Problem List:   PT Treatment Interventions:     PT Goals Acute Rehab PT Goals PT Goal Formulation: With patient Time For Goal Achievement: 04/22/12 Potential to Achieve Goals: Good Pt will go  Supine/Side to Sit: with supervision PT Goal: Supine/Side to Sit - Progress: Progressing toward goal Pt will go Sit to Supine/Side: with supervision PT Goal: Sit to Supine/Side - Progress: Progressing toward goal Pt will go Sit to Stand: with supervision PT Goal: Sit to Stand - Progress: Progressing toward goal Pt will go Stand to Sit: with supervision PT Goal: Stand to Sit - Progress: Progressing toward goal Pt will Ambulate: 51 - 150 feet;with supervision;with rolling walker PT Goal: Ambulate - Progress: Progressing toward goal  Visit Information  Last PT Received On: 04/17/12 Assistance Needed: +1    Subjective Data  Subjective: I feel much better than yesterday Patient Stated Goal: Resume previous lifestyle with decreased pain   Cognition  Overall Cognitive Status: Appears within functional limits for tasks assessed/performed Arousal/Alertness: Awake/alert Orientation Level: Appears intact for tasks assessed Behavior During Session: [facility] for tasks performed    Balance     End of Session PT - End of Session Equipment Utilized During Treatment: Gait belt Activity Tolerance: Patient tolerated treatment well Patient left: in chair;with call bell/phone within reach Nurse Communication: Mobility status   GP     Gerrit Rafalski 04/17/2012, 1:08 PM

## 2012-04-17 NOTE — Evaluation (Signed)
Occupational Therapy Evaluation Patient Details Name: Michelle Romero MRN: [id number] DOB: 12-05-1955 Today's Date: 04/17/2012 Time: 4098-1191 OT Time Calculation (min): 24 min  OT Assessment / Plan / Recommendation Clinical Impression  This 57 year old female is s/p R anterior direct approach THA.  She needs to be mod I to return home as husband cannot assist her.  She is appropriate for skilled OT with supervision to mod I level goals in acute    OT Assessment  Patient needs continued OT Services    Follow Up Recommendations  SNF (vs. HHOT depending upon progress)    Barriers to Discharge Decreased caregiver support    Equipment Recommendations  3 in 1 bedside comode    Recommendations for Other Services    Frequency  Min 2X/week    Precautions / Restrictions Precautions Precautions: Fall Restrictions Weight Bearing Restrictions: No Other Position/Activity Restrictions: WBAT   Pertinent Vitals/Pain RLE stiff, sore.  Repositioned    ADL  Grooming: Performed;Supervision/safety;Teeth care Where Assessed - Grooming: Supported standing Upper Body Bathing: Simulated;Set up Where Assessed - Upper Body Bathing: Unsupported sitting Lower Body Bathing: Simulated;Minimal assistance (with AE) Where Assessed - Lower Body Bathing: Supported sit to stand Upper Body Dressing: Simulated;Minimal assistance (iv) Where Assessed - Upper Body Dressing: Unsupported sitting Lower Body Dressing: Simulated;Moderate assistance Where Assessed - Lower Body Dressing: Supported sit to Pharmacist, hospital: Mining engineer Method: Sit to stand (3 steps, chair, sink, chair) Toileting - Clothing Manipulation and Hygiene: Simulated;Min guard Where Assessed - Toileting Clothing Manipulation and Hygiene: Sit to stand from 3-in-1 or toilet Equipment Used: Rolling walker Transfers/Ambulation Related to ADLs: Pt has long tub bench and has helped husband and son with this:  feels comfortable ADL Comments: Pt is able to reach to mid-calf.  Educated on AE:  husband used to have it, but no longer does.  Pt will have to be mod I at home.  She has frozen food prestocked    OT Diagnosis: Generalized weakness  OT Problem List: Decreased strength;Decreased knowledge of use of DME or AE;Pain;Decreased activity tolerance OT Treatment Interventions: Self-care/ADL training;DME and/or AE instruction;Therapeutic activities;Patient/family education   OT Goals Acute Rehab OT Goals OT Goal Formulation: With patient Time For Goal Achievement: 04/24/12 Potential to Achieve Goals: Good ADL Goals Pt Will Transfer to Toilet: with modified independence;Ambulation;3-in-1 ADL Goal: Toilet Transfer - Progress: Goal set today Pt Will Perform Toileting - Hygiene: with modified independence;Sit to stand from 3-in-1/toilet (all aspects of toileting) ADL Goal: Toileting - Hygiene - Progress: Goal set today Miscellaneous OT Goals Miscellaneous OT Goal #1: Pt will gather clothes with RW and complete adl at mod I level, using ae, sit to stand OT Goal: Miscellaneous Goal #1 - Progress: Goal set today  Visit Information  Last OT Received On: 04/17/12 Assistance Needed: +1    Subjective Data  Subjective: I still feel a little groggy.   Patient Stated Goal: get back to being independent; needs to be independent to return home as husband cannot provide physical assistance   Prior Functioning     Home Living Lives With: Spouse Bathroom Shower/Tub: Engineer, manufacturing systems: Standard Home Adaptive Equipment: Tub transfer bench Prior Function Level of Independence: Independent Able to Take Stairs?: Yes Driving: Yes Communication Communication: No difficulties Dominant Hand: Right         Vision/Perception     Cognition  Overall Cognitive Status: Appears within functional limits for tasks assessed/performed Arousal/Alertness: Awake/alert Orientation Level: Appears  intact for tasks  assessed Behavior During Session: [facility] for tasks performed    Extremity/Trunk Assessment Right Upper Extremity Assessment RUE ROM/Strength/Tone: [facility] for tasks assessed Left Upper Extremity Assessment LUE ROM/Strength/Tone: [facility] for tasks assessed     Mobility Bed Mobility Bed Mobility: Supine to Sit Supine to Sit: 4: Min assist Details for Bed Mobility Assistance: cues for sequence and use of L LE to self assist Transfers Sit to Stand: 4: Min assist Stand to Sit: 4: Min assist Details for Transfer Assistance: cues for LE for comfort     Shoulder Instructions     Exercise    Balance     End of Session OT - End of Session Activity Tolerance: Patient tolerated treatment well Patient left: in chair;with call bell/phone within reach  GO     Velton Roselle 04/17/2012, 2:02 PM Marica Otter, OTR/L [phone] 04/17/2012

## 2012-04-17 NOTE — Progress Notes (Signed)
Clinical Social Work Department CLINICAL SOCIAL WORK PLACEMENT NOTE 04/17/2012  Patient:  Michelle Romero, Michelle Romero  Account Number:  [id number] Admit date:  04/16/2012  Clinical Social Worker:  Cori Razor, LCSW  Date/time:  04/17/2012 08:08 AM  Clinical Social Work is seeking post-discharge placement for this patient at the following level of care:   SKILLED NURSING   (*CSW will update this form in Epic as items are completed)     Patient/family provided with [facility] Department of Clinical Social Work's list of facilities offering this level of care within the geographic area requested by the patient (or if unable, by the patient's family).  04/17/2012  Patient/family informed of their freedom to choose among providers that offer the needed level of care, that participate in Medicare, Medicaid or managed care program needed by the patient, have an available bed and are willing to accept the patient.    Patient/family informed of [facility]' ownership interest in [facility], as well as of the fact that they are under no obligation to receive care at this facility.  PASARR submitted to EDS on 04/16/2012 PASARR number received from EDS on 04/16/2012  FL2 transmitted to all facilities in geographic area requested by pt/family on  04/17/2012 FL2 transmitted to all facilities within larger geographic area on   Patient informed that his/her managed care company has contracts with or will negotiate with  certain facilities, including the following:     Patient/family informed of bed offers received:  04/17/2012 Patient chooses bed at [facility] Physician recommends and patient chooses bed at    Patient to be transferred to [facility] on   Patient to be transferred to facility by   The following physician request were entered in Epic:   Additional Comments: SNF bed offer pending insurance approval.  Cori Razor LCSW [phone]

## 2012-04-17 NOTE — Progress Notes (Signed)
   Subjective: 1 Day Post-Op Procedure(s) (LRB): TOTAL HIP ARTHROPLASTY ANTERIOR APPROACH (Right) Patient reports pain as mild pain thru the night but better this morning. Patient seen in rounds with Dr. Lequita Halt. Patient is well, and has had no acute complaints or problems We will start therapy today.  Plan is to go Home versus Rehab if needed after hospital stay.  Objective: Vital signs in last 24 hours: Temp:  [97.4 F (36.3 C)-99 F (37.2 C)] 98.9 F (37.2 C) (01/16 0450) Pulse Rate:  [58-107] 84  (01/16 0450) Resp:  [9-20] 15  (01/16 0800) BP: (103-163)/(55-82) 103/61 mmHg (01/16 0450) SpO2:  [97 %-100 %] 100 % (01/16 0450) Weight:  [84.369 kg (186 lb)] 84.369 kg (186 lb) (01/15 1330)  Intake/Output from previous day:  Intake/Output Summary (Last 24 hours) at 04/17/12 0824 Last data filed at 04/17/12 0743  Gross per 24 hour  Intake 6496.25 ml  Output   4295 ml  Net 2201.25 ml    Intake/Output this shift: UOP 1180 since MN +2201 Total I/O In: -  Out: 150 [Urine:150]  Labs:  Basename 04/17/12 0400  HGB 9.7*    Basename 04/17/12 0400  WBC 12.4*  RBC 3.17*  HCT 29.1*  PLT 256    Basename 04/17/12 0400  NA 139  K 3.9  CL 105  CO2 28  BUN 9  CREATININE 0.57  GLUCOSE 122*  CALCIUM 8.7   No results found for this basename: LABPT:2,INR:2 in the last 72 hours  EXAM General - Patient is Alert, Appropriate and Oriented Extremity - Neurovascular intact Sensation intact distally Dorsiflexion/Plantar flexion intact Dressing - dressing C/D/I Motor Function - intact, moving foot and toes well on exam.  Hemovac pulled without difficulty.  Past Medical History  Diagnosis Date  . Vitamin D deficiency   . Chicken pox as a child  . Measles as a child  . 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 Hyperlipidemia   . Overweight   . Precancerous skin lesion 10/30/2011    Left abdomen, excised  . Viral infection 10/30/2011  . Vertigo 10/30/2011  . Elevated BP 11/30/2011  .  Dupuytren's contracture of hand 01/30/2012  . Abdominal mass, right upper quadrant 01/30/2012  . Allergic state 01/30/2012  . Reflux 01/30/2012  . DJD (degenerative joint disease) of hip 03/17/2012  . PONV (postoperative nausea and vomiting)   . GERD (gastroesophageal reflux disease)   . History of blood transfusion     Assessment/Plan: 1 Day Post-Op Procedure(s) (LRB): TOTAL HIP ARTHROPLASTY ANTERIOR APPROACH (Right) Principal Problem:  *OA (osteoarthritis) of hip Active Problems:  Postop Acute blood loss anemia  Estimated Body mass index is 25.94 kg/(m^2) as calculated from the following:   Height as of this encounter: 5\' 11" (1.803 m).   Weight as of this encounter: 186 lb(84.369 kg). Advance diet Up with therapy Discharge home with home health versus SNF/Rehab  DVT Prophylaxis - Xarelto Weight Bearing As Tolerated right Leg Hemovac Pulled Begin Therapy No vaccines.  Amy Gothard 04/17/2012, 8:24 AM

## 2012-04-17 NOTE — Progress Notes (Signed)
Physical Therapy Treatment Patient Details Name: Michelle Romero MRN: [id number] DOB: May 23, 1955 Today's Date: 04/17/2012 Time: 1191-4782 PT Time Calculation (min): 19 min  PT Assessment / Plan / Recommendation Comments on Treatment Session       Follow Up Recommendations  Home health PT;SNF     Does the patient have the potential to tolerate intense rehabilitation     Barriers to Discharge        Equipment Recommendations  Rolling walker with 5" wheels    Recommendations for Other Services OT consult  Frequency 7X/week   Plan Discharge plan remains appropriate    Precautions / Restrictions Precautions Precautions: Fall Restrictions Weight Bearing Restrictions: No Other Position/Activity Restrictions: WBAT   Pertinent Vitals/Pain Mostly it just feels stiff; premedicated    Mobility  Bed Mobility Bed Mobility: Sit to Supine Supine to Sit: 4: Min assist Sit to Supine: 4: Min guard Details for Bed Mobility Assistance: pt self assisted R LE with L LE Transfers Transfers: Sit to Stand;Stand to Sit Sit to Stand: 4: Min guard Stand to Sit: 4: Min guard Details for Transfer Assistance: cues for use of UEs Ambulation/Gait Ambulation/Gait Assistance: 4: Min guard Ambulation Distance (Feet): 147 Feet Assistive device: Rolling walker Ambulation/Gait Assistance Details: min cues for position from RW, posture and ER on R Gait Pattern: Step-to pattern Stairs: No    Exercises Total Joint Exercises Ankle Circles/Pumps: AROM;Supine;Both;20 reps Quad Sets: AROM;10 reps;Both;Supine Gluteal Sets: AROM;10 reps;Supine;Both Heel Slides: AAROM;Supine;Right;20 reps Hip ABduction/ADduction: AAROM;Right;Supine;20 reps   PT Diagnosis:    PT Problem List:   PT Treatment Interventions:     PT Goals Acute Rehab PT Goals PT Goal Formulation: With patient Time For Goal Achievement: 04/22/12 Potential to Achieve Goals: Good Pt will go Supine/Side to Sit: with supervision PT Goal:  Supine/Side to Sit - Progress: Progressing toward goal Pt will go Sit to Supine/Side: with supervision PT Goal: Sit to Supine/Side - Progress: Progressing toward goal Pt will go Sit to Stand: with supervision PT Goal: Sit to Stand - Progress: Progressing toward goal Pt will go Stand to Sit: with supervision PT Goal: Stand to Sit - Progress: Progressing toward goal Pt will Ambulate: 51 - 150 feet;with supervision;with rolling walker PT Goal: Ambulate - Progress: Progressing toward goal  Visit Information  Last PT Received On: 04/17/12 Assistance Needed: +1    Subjective Data  Subjective: I'm ready to walk Patient Stated Goal: Resume previous lifestyle with decreased pain   Cognition  Overall Cognitive Status: Appears within functional limits for tasks assessed/performed Arousal/Alertness: Awake/alert Orientation Level: Appears intact for tasks assessed Behavior During Session: [facility] for tasks performed    Balance     End of Session PT - End of Session Equipment Utilized During Treatment: Gait belt Activity Tolerance: Patient tolerated treatment well Patient left: in bed;with call bell/phone within reach Nurse Communication: Mobility status   GP     Michelle Romero 04/17/2012, 2:34 PM

## 2012-04-17 NOTE — Progress Notes (Signed)
Clinical Social Work Department BRIEF PSYCHOSOCIAL ASSESSMENT 04/17/2012  Patient:  Michelle Romero, Michelle Romero     Account Number:  [id number]     Admit date:  04/16/2012  Clinical Social Worker:  Candie Chroman  Date/Time:  04/17/2012 07:54 AM  Referred by:  CSW  Date Referred:  04/16/2012 Referred for  SNF Placement   Other Referral:   Interview type:  Patient Other interview type:    PSYCHOSOCIAL DATA Living Status:  HUSBAND Admitted from facility:   Level of care:   Primary support name:  Izola Price Primary support relationship to patient:  SPOUSE Degree of support available:   limited    CURRENT CONCERNS Current Concerns  Post-Acute Placement   Other Concerns:    SOCIAL WORK ASSESSMENT / PLAN Pt is a 74 yr. old female living at home prio to hospital. CSW met with pt to assist with d/c planning  needs. Pt has made prior arrangements to have ST Rehab at [facility] following hospital d/c. CSW has contacted sNF and d/c plans have been confirmed. Pt's first choice is to go home with [facility] but will accept ST Rehab if needed and BCBS will provide coverage for stay. CSW will follow to assist with d/c planning needs.   Assessment/plan status:  Psychosocial Support/Ongoing Assessment of Needs Other assessment/ plan:   Information/referral to community resources:   None needed at this time.    PATIENT'S/FAMILY'S RESPONSE TO PLAN OF CARE: Pt would like to go home but will have rehab at [facility] if needed.   Cori Razor LCSW [phone]

## 2012-04-18 LAB — BASIC METABOLIC PANEL
Calcium: 9.2 mg/dL (ref 8.4–10.5)
Creatinine, Ser: 0.52 mg/dL (ref 0.50–1.10)
GFR calc non Af Amer: >90 mL/min (ref >90)
Glucose, Bld: 107 mg/dL — ABNORMAL HIGH (ref 70–99)
Sodium: 140 mEq/L (ref 135–145)

## 2012-04-18 LAB — CBC
Hemoglobin: 10.3 g/dL — ABNORMAL LOW (ref 12.0–15.0)
MCH: 30.5 pg (ref 26.0–34.0)
MCHC: 32.8 g/dL (ref 30.0–36.0)
MCV: 92.9 fL (ref 78.0–100.0)

## 2012-04-18 NOTE — Progress Notes (Signed)
Physical Therapy Treatment Patient Details Name: Michelle Romero MRN: [id number] DOB: 26-Aug-1955 Today's Date: 04/18/2012 Time: 1013-1028 PT Time Calculation (min): 15 min  PT Assessment / Plan / Recommendation Comments on Treatment Session       Follow Up Recommendations  Home health PT     Does the patient have the potential to tolerate intense rehabilitation     Barriers to Discharge        Equipment Recommendations  Rolling walker with 5" wheels    Recommendations for Other Services OT consult  Frequency 7X/week   Plan Discharge plan remains appropriate    Precautions / Restrictions Precautions Precautions: Fall Restrictions Weight Bearing Restrictions: No Other Position/Activity Restrictions: WBAT   Pertinent Vitals/Pain 3-4/10 with amb; premedicated    Mobility  Transfers Transfers: Sit to Stand;Stand to Sit Sit to Stand: 6: Modified independent (Device/Increase time) Stand to Sit: 5: Supervision Details for Transfer Assistance: cues for use of UEs Ambulation/Gait Ambulation/Gait Assistance: 4: Min guard Ambulation Distance (Feet): 300 Feet Assistive device: Rolling walker Ambulation/Gait Assistance Details: cues for positiion from RW and ER on R - pregressed to recip gait Gait Pattern: Step-to pattern;Step-through pattern Stairs: No    Exercises     PT Diagnosis:    PT Problem List:   PT Treatment Interventions:     PT Goals Acute Rehab PT Goals PT Goal Formulation: With patient Time For Goal Achievement: 04/22/12 Potential to Achieve Goals: Good Pt will go Supine/Side to Sit: with supervision PT Goal: Supine/Side to Sit - Progress: Progressing toward goal Pt will go Sit to Supine/Side: with supervision PT Goal: Sit to Supine/Side - Progress: Progressing toward goal Pt will go Sit to Stand: with supervision PT Goal: Sit to Stand - Progress: Progressing toward goal Pt will go Stand to Sit: with supervision PT Goal: Stand to Sit - Progress:  Progressing toward goal Pt will Ambulate: 51 - 150 feet;with supervision;with rolling walker PT Goal: Ambulate - Progress: Progressing toward goal  Visit Information  Last PT Received On: 04/18/12 Assistance Needed: +1    Subjective Data  Subjective: I am doing so well I am going home tomorrow instead of to rehab Patient Stated Goal: Resume previous lifestyle with decreased pain   Cognition  Overall Cognitive Status: Appears within functional limits for tasks assessed/performed Arousal/Alertness: Awake/alert Orientation Level: Appears intact for tasks assessed Behavior During Session: [facility] for tasks performed    Balance     End of Session PT - End of Session Activity Tolerance: Patient tolerated treatment well Patient left: in chair;with call bell/phone within reach Nurse Communication: Mobility status   GP     Slayden Mennenga 04/18/2012, 11:43 AM

## 2012-04-18 NOTE — Progress Notes (Signed)
Physical Therapy Treatment Patient Details Name: Michelle Romero MRN: [id number] DOB: 1955-04-08 Today's Date: 04/18/2012 Time: 4098-1191 PT Time Calculation (min): 28 min  PT Assessment / Plan / Recommendation Comments on Treatment Session  Reviewed car transfers with pt    Follow Up Recommendations  Home health PT     Does the patient have the potential to tolerate intense rehabilitation     Barriers to Discharge        Equipment Recommendations  Rolling walker with 5" wheels    Recommendations for Other Services OT consult  Frequency 7X/week   Plan Discharge plan remains appropriate    Precautions / Restrictions Precautions Precautions: Fall Restrictions Weight Bearing Restrictions: No Other Position/Activity Restrictions: WBAT   Pertinent Vitals/Pain 4/10; pt declines pain meds and ice pack    Mobility  Bed Mobility Bed Mobility: Sit to Supine Sit to Supine: 5: Supervision Details for Bed Mobility Assistance: pt self assisted R LE with L LE Transfers Transfers: Sit to Stand;Stand to Sit Sit to Stand: 6: Modified independent (Device/Increase time) Stand to Sit: 5: Supervision Details for Transfer Assistance: cues for use of UEs Ambulation/Gait Ambulation/Gait Assistance: 4: Min guard;5: Supervision Ambulation Distance (Feet): 260 Feet Assistive device: Rolling walker Ambulation/Gait Assistance Details: cues for stride length, position from RW and ER on R Gait Pattern: Step-to pattern;Step-through pattern Stairs: Yes Stairs Assistance: 4: Min guard Stair Management Technique: Two rails;Step to pattern;Forwards Number of Stairs: 2     Exercises Total Joint Exercises Ankle Circles/Pumps: AROM;Supine;Both;20 reps Quad Sets: AROM;Both;Supine;20 reps Gluteal Sets: AROM;Supine;Both;20 reps Heel Slides: AAROM;Supine;Right;20 reps Hip ABduction/ADduction: AAROM;Right;Supine;20 reps   PT Diagnosis:    PT Problem List:   PT Treatment Interventions:     PT  Goals Acute Rehab PT Goals PT Goal Formulation: With patient Time For Goal Achievement: 04/22/12 Potential to Achieve Goals: Good Pt will go Supine/Side to Sit: with modified independence PT Goal: Supine/Side to Sit - Progress: Updated due to goal met Pt will go Sit to Supine/Side: with modified independence PT Goal: Sit to Supine/Side - Progress: Updated due to goal met Pt will go Sit to Stand: with modified independence PT Goal: Sit to Stand - Progress: Updated due to goal met Pt will go Stand to Sit: with modified independence PT Goal: Stand to Sit - Progress: Updated due to goals met Pt will Ambulate: 51 - 150 feet;with rolling walker;with modified independence PT Goal: Ambulate - Progress: Updated due to goal met  Visit Information  Last PT Received On: 04/18/12 Assistance Needed: +1    Subjective Data  Patient Stated Goal: Resume previous lifestyle with decreased pain   Cognition  Overall Cognitive Status: Appears within functional limits for tasks assessed/performed Arousal/Alertness: Awake/alert Orientation Level: Appears intact for tasks assessed Behavior During Session: [facility] for tasks performed    Balance     End of Session PT - End of Session Activity Tolerance: Patient tolerated treatment well Patient left: in bed;with call bell/phone within reach Nurse Communication: Mobility status   GP     Ege Muckey 04/18/2012, 2:25 PM

## 2012-04-18 NOTE — Progress Notes (Signed)
CSW reviewed PN and met with pt. Pt is progressing well and plans to d/c home following hospital d/c. RNCM will assist with d/c planning needs.   Cori Razor LCSW [phone]

## 2012-04-18 NOTE — Progress Notes (Signed)
Occupational Therapy Treatment Patient Details Name: Michelle Romero MRN: [id number] DOB: 02-13-1956 Today's Date: 04/18/2012 Time: 4098-1191 OT Time Calculation (min): 41 min  OT Assessment / Plan / Recommendation Comments on Treatment Session      Follow Up Recommendations  No OT follow up    Barriers to Discharge       Equipment Recommendations  3 in 1 bedside comode    Recommendations for Other Services    Frequency Min 2X/week   Plan      Precautions / Restrictions Precautions Precautions: Fall Restrictions Other Position/Activity Restrictions: WBAT   Pertinent Vitals/Pain No pain    ADL  Grooming: Performed;Wash/dry face;Wash/dry hands;Teeth care;Modified independent Where Assessed - Grooming: Supported standing Lower Body Bathing: Performed;Set up (with ae) Where Assessed - Lower Body Bathing: Supported sit to stand Lower Body Dressing: Performed;Set up (with ae) Where Assessed - Lower Body Dressing: Supported sit to stand Toilet Transfer: Performed;Modified independent Toilet Transfer Method: Sit to stand Toilet Transfer Equipment: Raised toilet seat with arms (or 3-in-1 over toilet) Toileting - Clothing Manipulation and Hygiene: Performed;Modified independent Where Assessed - Toileting Clothing Manipulation and Hygiene: Sit to stand from 3-in-1 or toilet Equipment Used: Rolling walker Transfers/Ambulation Related to ADLs: ambulated to bathroom at mod I level:  no cues needed ADL Comments: Performed adl with set up using AE.  Discussed clothing retrieval  We do not have walker bags to issue.  Discussed thick plastic bag tied on to walker, like [employer] bag and using snapware type containers, paper plates as she does all iadls for husband and herself.  Provided velcro for reacher on walker.  Pt will get AE kit.      OT Diagnosis:    OT Problem List:   OT Treatment Interventions:     OT Goals ADL Goals Pt Will Transfer to Toilet: with modified  independence;Ambulation;3-in-1 ADL Goal: Toilet Transfer - Progress: Met Pt Will Perform Toileting - Hygiene: with modified independence;Sit to stand from 3-in-1/toilet ADL Goal: Toileting - Hygiene - Progress: Met Miscellaneous OT Goals Miscellaneous OT Goal #1: Pt will gather clothes with RW and complete adl at mod I level, using ae, sit to stand OT Goal: Miscellaneous Goal #1 - Progress: Partly met (did not gather clothes but discussed walker safety with retr)  Visit Information  Last OT Received On: 04/18/12 Assistance Needed: +1    Subjective Data      Prior Functioning       Cognition  Overall Cognitive Status: Appears within functional limits for tasks assessed/performed Arousal/Alertness: Awake/alert Orientation Level: Appears intact for tasks assessed Behavior During Session: [facility] for tasks performed    Mobility  Shoulder Instructions Transfers Sit to Stand: 6: Modified independent (Device/Increase time);From bed;From chair/3-in-1;With upper extremity assist;With armrests       Exercises      Balance     End of Session OT - End of Session Activity Tolerance: Patient tolerated treatment well Patient left: in chair;with call bell/phone within reach  GO     [facility] 04/18/2012, 9:26 AM Marica Otter, OTR/L [phone] 04/18/2012

## 2012-04-18 NOTE — Progress Notes (Signed)
   Subjective: 2 Days Post-Op Procedure(s) (LRB): TOTAL HIP ARTHROPLASTY ANTERIOR APPROACH (Right) Patient reports pain as mild.   Patient seen in rounds with Dr. Lequita Halt. Patient is well, and has had no acute complaints or problems We will start therapy today.  Plan is to go Home after hospital stay.  Objective: Vital signs in last 24 hours: Temp:  [97.7 F (36.5 C)-98.4 F (36.9 C)] 97.9 F (36.6 C) (01/17 0433) Pulse Rate:  [75-93] 75  (01/17 0433) Resp:  [15-16] 16  (01/17 0433) BP: (123-168)/(71-80) 168/80 mmHg (01/17 0433) SpO2:  [94 %-99 %] 99 % (01/17 0433)  Intake/Output from previous day:  Intake/Output Summary (Last 24 hours) at 04/18/12 0909 Last data filed at 04/18/12 0434  Gross per 24 hour  Intake    780 ml  Output   4425 ml  Net  -3645 ml    Intake/Output this shift: UOP 1075 since MN -1163  Labs:  Basename 04/18/12 0409 04/17/12 0400  HGB 10.3* 9.7*    Basename 04/18/12 0409 04/17/12 0400  WBC 14.5* 12.4*  RBC 3.38* 3.17*  HCT 31.4* 29.1*  PLT 234 256    Basename 04/18/12 0409 04/17/12 0400  NA 140 139  K 3.8 3.9  CL 104 105  CO2 28 28  BUN 11 9  CREATININE 0.52 0.57  GLUCOSE 107* 122*  CALCIUM 9.2 8.7   No results found for this basename: LABPT:2,INR:2 in the last 72 hours  EXAM General - Patient is Alert, Appropriate and Oriented Extremity - Neurovascular intact Sensation intact distally Dorsiflexion/Plantar flexion intact No cellulitis present Incision - Looks good and healing well Motor Function - intact, moving foot and toes well on exam.    Past Medical History  Diagnosis Date  . Vitamin D deficiency   . Chicken pox as a child  . Measles as a child  . 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 Hyperlipidemia   . Overweight   . Precancerous skin lesion 10/30/2011    Left abdomen, excised  . Viral infection 10/30/2011  . Vertigo 10/30/2011  . Elevated BP 11/30/2011  . Dupuytren's contracture of hand 01/30/2012  . Abdominal mass, right  upper quadrant 01/30/2012  . Allergic state 01/30/2012  . Reflux 01/30/2012  . DJD (degenerative joint disease) of hip 03/17/2012  . PONV (postoperative nausea and vomiting)   . GERD (gastroesophageal reflux disease)   . History of blood transfusion     Assessment/Plan: 2 Days Post-Op Procedure(s) (LRB): TOTAL HIP ARTHROPLASTY ANTERIOR APPROACH (Right) Principal Problem:  *OA (osteoarthritis) of hip Active Problems:  Postop Acute blood loss anemia  Estimated Body mass index is 25.94 kg/(m^2) as calculated from the following:   Height as of this encounter: 5\' 11" (1.803 m).   Weight as of this encounter: 186 lb(84.369 kg). Advance diet Up with therapy Plan for discharge tomorrow Discharge home with home health  DVT Prophylaxis - Xarelto Weight Bearing As Tolerated right Leg  PERKINS, ALEXZANDREW 04/18/2012, 9:09 AM

## 2012-04-19 LAB — CBC
Hemoglobin: 9.2 g/dL — ABNORMAL LOW (ref 12.0–15.0)
Platelets: 208 10*3/uL (ref 150–400)
RBC: 2.99 MIL/uL — ABNORMAL LOW (ref 3.87–5.11)
WBC: 10.3 10*3/uL (ref 4.0–10.5)

## 2012-04-19 MED ORDER — METHOCARBAMOL 500 MG PO TABS: 500.0000 mg | ORAL_TABLET | Freq: Four times a day (QID) | ORAL | Status: DC | PRN

## 2012-04-19 MED ORDER — RIVAROXABAN 10 MG PO TABS: 10.0000 mg | ORAL_TABLET | Freq: Every day | ORAL | Status: DC

## 2012-04-19 MED ORDER — HYDROMORPHONE HCL 2 MG PO TABS: 2.0000 mg | ORAL_TABLET | ORAL | Status: DC | PRN

## 2012-04-19 NOTE — Progress Notes (Signed)
Pt to d/c home with [facility] home health. Equipment delivered to room. AVS reviewed and "My Chart" discussed with pt. Pt capable of verbalizing medications and follow-up appointments with Dr. Lequita Halt. Remains hemodynamically stable. No signs and symptoms of distress. Educated pt to return to ER in the case of SOB, dizziness, or chest pain.

## 2012-04-19 NOTE — Care Management (Signed)
Per PT recommendations pt to d.c home with [facility] services. Per pt choice [facility] to provide HHPT upon dc. Genevieve Norlander rep Jacki Cones made aware of referral. Pt request DME. [facility] to deliver DME to room prior to dc/. No other needs stated.  Michelle Romero [phone]

## 2012-04-19 NOTE — Progress Notes (Signed)
Subjective: 3 Days Post-Op Procedure(s) (LRB): TOTAL HIP ARTHROPLASTY ANTERIOR APPROACH (Right) Patient reports pain as mild.  No c/o.  Eager to go home.  Objective: Vital signs in last 24 hours: Temp:  [98.2 F (36.8 C)-98.7 F (37.1 C)] 98.2 F (36.8 C) (01/18 0602) Pulse Rate:  [88-97] 88  (01/18 0602) Resp:  [15-16] 15  (01/18 1101) BP: (132-144)/(65-77) 144/77 mmHg (01/18 0602) SpO2:  [95 %-99 %] 95 % (01/18 0602)  Intake/Output from previous day: 01/17 0701 - 01/18 0700 In: 720 [P.O.:720] Out: -  Intake/Output this shift: Total I/O In: 240 [P.O.:240] Out: -    Basename 04/19/12 0436 04/18/12 0409 04/17/12 0400  HGB 9.2* 10.3* 9.7*    Basename 04/19/12 0436 04/18/12 0409  WBC 10.3 14.5*  RBC 2.99* 3.38*  HCT 27.9* 31.4*  PLT 208 234    Basename 04/18/12 0409 04/17/12 0400  NA 140 139  K 3.8 3.9  CL 104 105  CO2 28 28  BUN 11 9  CREATININE 0.52 0.57  GLUCOSE 107* 122*  CALCIUM 9.2 8.7     R anterior hip wound dressed and dry.  Sens to LT intact at R LE.  5/5 strength in ankle DF.  Assessment/Plan: 3 Days Post-Op Procedure(s) (LRB): TOTAL HIP ARTHROPLASTY ANTERIOR APPROACH (Right) Discharge home with home health  Toni Arthurs 04/19/2012, 11:37 AM

## 2012-04-19 NOTE — Discharge Summary (Signed)
Physician Discharge Summary   Patient ID: Michelle Romero MRN: [id number] DOB/AGE: 1955/12/25 57 y.o.  Admit date: 04/16/2012 Discharge date: 04/19/2012  Primary Diagnosis: Osteoarthritis of the Right hip.   Admission Diagnoses:  Past Medical History  Diagnosis Date  . Vitamin D deficiency   . Chicken pox as a child  . Measles as a child  . 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 Hyperlipidemia   . Overweight   . Precancerous skin lesion 10/30/2011    Left abdomen, excised  . Viral infection 10/30/2011  . Vertigo 10/30/2011  . Elevated BP 11/30/2011  . Dupuytren's contracture of hand 01/30/2012  . Abdominal mass, right upper quadrant 01/30/2012  . Allergic state 01/30/2012  . Reflux 01/30/2012  . DJD (degenerative joint disease) of hip 03/17/2012  . PONV (postoperative nausea and vomiting)   . GERD (gastroesophageal reflux disease)   . History of blood transfusion    Discharge Diagnoses:   Principal Problem:  *OA (osteoarthritis) of hip Active Problems:  Postop Acute blood loss anemia  Estimated Body mass index is 25.94 kg/(m^2) as calculated from the following:   Height as of this encounter: 5\' 11" (1.803 m).   Weight as of this encounter: 186 lb(84.369 kg).  Classification of overweight in adults according to BMI (WHO, 1998)   Procedure: Procedure(s) (LRB): TOTAL HIP ARTHROPLASTY ANTERIOR APPROACH (Right)   Consults: None  HPI: Michelle Romero is a 57 y.o. female who has advanced end-  stage arthritis of his Right hip with progressively worsening pain and  dysfunction.The patient has failed nonoperative management and presents for  total hip arthroplasty. She has bone on bone arthritis with massive osteophyte formation.  Laboratory Data: Admission on 04/16/2012  Component Date Value Range Status  . ABO/RH(D) 04/16/2012 O POS   Final  . Antibody Screen 04/16/2012 NEG   Final  . Sample Expiration 04/16/2012 04/19/2012   Final  . ABO/RH(D) 04/16/2012 O POS   Final  . WBC  04/17/2012 12.4* 4.0 - 10.5 K/uL Final  . RBC 04/17/2012 3.17* 3.87 - 5.11 MIL/uL Final  . Hemoglobin 04/17/2012 9.7* 12.0 - 15.0 g/dL Final  . HCT 04/04/7251 29.1* 36.0 - 46.0 % Final  . MCV 04/17/2012 91.8  78.0 - 100.0 fL Final  . MCH 04/17/2012 30.6  26.0 - 34.0 pg Final  . MCHC 04/17/2012 33.3  30.0 - 36.0 g/dL Final  . RDW 66/44/0347 12.9  11.5 - 15.5 % Final  . Platelets 04/17/2012 256  150 - 400 K/uL Final  . Sodium 04/17/2012 139  135 - 145 mEq/L Final  . Potassium 04/17/2012 3.9  3.5 - 5.1 mEq/L Final  . Chloride 04/17/2012 105  96 - 112 mEq/L Final  . CO2 04/17/2012 28  19 - 32 mEq/L Final  . Glucose, Bld 04/17/2012 122* 70 - 99 mg/dL Final  . BUN 42/59/5638 9  6 - 23 mg/dL Final  . Creatinine, Ser 04/17/2012 0.57  0.50 - 1.10 mg/dL Final  . Calcium 75/64/3329 8.7  8.4 - 10.5 mg/dL Final  . GFR calc non Af Amer 04/17/2012 >90  >90 mL/min Final  . GFR calc Af Amer 04/17/2012 >90  >90 mL/min Final   Comment:                                 The eGFR has been calculated  using the CKD EPI equation.                          This calculation has not been                          validated in all clinical                          situations.                          eGFR's persistently                          <90 mL/min signify                          possible Chronic Kidney Disease.  . WBC 04/18/2012 14.5* 4.0 - 10.5 K/uL Final  . RBC 04/18/2012 3.38* 3.87 - 5.11 MIL/uL Final  . Hemoglobin 04/18/2012 10.3* 12.0 - 15.0 g/dL Final  . HCT 16/01/9603 31.4* 36.0 - 46.0 % Final  . MCV 04/18/2012 92.9  78.0 - 100.0 fL Final  . MCH 04/18/2012 30.5  26.0 - 34.0 pg Final  . MCHC 04/18/2012 32.8  30.0 - 36.0 g/dL Final  . RDW 54/12/8117 13.3  11.5 - 15.5 % Final  . Platelets 04/18/2012 234  150 - 400 K/uL Final  . Sodium 04/18/2012 140  135 - 145 mEq/L Final  . Potassium 04/18/2012 3.8  3.5 - 5.1 mEq/L Final  . Chloride 04/18/2012 104  96 - 112 mEq/L Final    . CO2 04/18/2012 28  19 - 32 mEq/L Final  . Glucose, Bld 04/18/2012 107* 70 - 99 mg/dL Final  . BUN 14/78/2956 11  6 - 23 mg/dL Final  . Creatinine, Ser 04/18/2012 0.52  0.50 - 1.10 mg/dL Final  . Calcium 21/30/8657 9.2  8.4 - 10.5 mg/dL Final  . GFR calc non Af Amer 04/18/2012 >90  >90 mL/min Final  . GFR calc Af Amer 04/18/2012 >90  >90 mL/min Final   Comment:                                 The eGFR has been calculated                          using the CKD EPI equation.                          This calculation has not been                          validated in all clinical                          situations.                          eGFR's persistently                          <90 mL/min signify  possible Chronic Kidney Disease.  . WBC 04/19/2012 10.3  4.0 - 10.5 K/uL Final  . RBC 04/19/2012 2.99* 3.87 - 5.11 MIL/uL Final  . Hemoglobin 04/19/2012 9.2* 12.0 - 15.0 g/dL Final  . HCT 40/98/1191 27.9* 36.0 - 46.0 % Final  . MCV 04/19/2012 93.3  78.0 - 100.0 fL Final  . MCH 04/19/2012 30.8  26.0 - 34.0 pg Final  . MCHC 04/19/2012 33.0  30.0 - 36.0 g/dL Final  . RDW 47/82/9562 13.7  11.5 - 15.5 % Final  . Platelets 04/19/2012 208  150 - 400 K/uL Final  Hospital Outpatient Visit on 04/08/2012  Component Date Value Range Status  . aPTT 04/08/2012 32  24 - 37 seconds Final  . WBC 04/08/2012 6.8  4.0 - 10.5 K/uL Final  . RBC 04/08/2012 4.38  3.87 - 5.11 MIL/uL Final  . Hemoglobin 04/08/2012 13.3  12.0 - 15.0 g/dL Final  . HCT 13/10/6576 40.3  36.0 - 46.0 % Final  . MCV 04/08/2012 92.0  78.0 - 100.0 fL Final  . MCH 04/08/2012 30.4  26.0 - 34.0 pg Final  . MCHC 04/08/2012 33.0  30.0 - 36.0 g/dL Final  . RDW 46/96/2952 13.1  11.5 - 15.5 % Final  . Platelets 04/08/2012 296  150 - 400 K/uL Final  . Sodium 04/08/2012 139  135 - 145 mEq/L Final  . Potassium 04/08/2012 4.2  3.5 - 5.1 mEq/L Final  . Chloride 04/08/2012 101  96 - 112 mEq/L Final  . CO2 04/08/2012  31  19 - 32 mEq/L Final  . Glucose, Bld 04/08/2012 62* 70 - 99 mg/dL Final  . BUN 84/13/2440 13  6 - 23 mg/dL Final  . Creatinine, Ser 04/08/2012 0.62  0.50 - 1.10 mg/dL Final  . Calcium 01/27/2535 9.8  8.4 - 10.5 mg/dL Final  . Total Protein 04/08/2012 7.1  6.0 - 8.3 g/dL Final  . Albumin 64/40/3474 3.8  3.5 - 5.2 g/dL Final  . AST 25/95/6387 15  0 - 37 U/L Final  . ALT 04/08/2012 12  0 - 35 U/L Final  . Alkaline Phosphatase 04/08/2012 107  39 - 117 U/L Final  . Total Bilirubin 04/08/2012 0.5  0.3 - 1.2 mg/dL Final  . GFR calc non Af Amer 04/08/2012 >90  >90 mL/min Final  . GFR calc Af Amer 04/08/2012 >90  >90 mL/min Final   Comment:                                 The eGFR has been calculated                          using the CKD EPI equation.                          This calculation has not been                          validated in all clinical                          situations.                          eGFR's persistently                          <  90 mL/min signify                          possible Chronic Kidney Disease.  Marland Kitchen Prothrombin Time 04/08/2012 12.8  11.6 - 15.2 seconds Final  . INR 04/08/2012 0.97  0.00 - 1.49 Final  . Color, Urine 04/08/2012 YELLOW  YELLOW Final  . APPearance 04/08/2012 CLEAR  CLEAR Final  . Specific Gravity, Urine 04/08/2012 1.008  1.005 - 1.030 Final  . pH 04/08/2012 7.5  5.0 - 8.0 Final  . Glucose, UA 04/08/2012 NEGATIVE  NEGATIVE mg/dL Final  . Hgb urine dipstick 04/08/2012 NEGATIVE  NEGATIVE Final  . Bilirubin Urine 04/08/2012 NEGATIVE  NEGATIVE Final  . Ketones, ur 04/08/2012 NEGATIVE  NEGATIVE mg/dL Final  . Protein, ur 45/40/9811 NEGATIVE  NEGATIVE mg/dL Final  . Urobilinogen, UA 04/08/2012 0.2  0.0 - 1.0 mg/dL Final  . Nitrite 91/47/8295 NEGATIVE  NEGATIVE Final  . Leukocytes, UA 04/08/2012 NEGATIVE  NEGATIVE Final   MICROSCOPIC NOT DONE ON URINES WITH NEGATIVE PROTEIN, BLOOD, LEUKOCYTES, NITRITE, OR GLUCOSE <1000 mg/dL.  Marland Kitchen MRSA, PCR  04/08/2012 NEGATIVE  NEGATIVE Final  . Staphylococcus aureus 04/08/2012 NEGATIVE  NEGATIVE Final   Comment:                                 The Xpert SA Assay (FDA                          approved for NASAL specimens                          in patients over 71 years of age),                          is one component of                          a comprehensive surveillance                          program.  Test performance has                          been validated by [employer] for patients greater                          than or equal to 46 year old.                          It is not intended                          to diagnose infection nor to                          guide or monitor treatment.  Office Visit on 03/17/2012  Component Date Value Range Status  . WBC 03/17/2012 6.2  4.5 - 10.5 K/uL Final  . RBC 03/17/2012 4.15  3.87 - 5.11 Mil/uL Final  . Platelets 03/17/2012 252.0  150.0 - 400.0 K/uL Final  . Hemoglobin 03/17/2012 12.9  12.0 - 15.0 g/dL Final  . HCT 16/01/9603 38.0  36.0 - 46.0 % Final  . MCV 03/17/2012 91.5  78.0 - 100.0 fl Final  . MCHC 03/17/2012 34.1  30.0 - 36.0 g/dL Final  . RDW 54/12/8117 13.5  11.5 - 14.6 % Final  . Sodium 03/17/2012 140  135 - 145 mEq/L Final  . Potassium 03/17/2012 4.1  3.5 - 5.1 mEq/L Final  . Chloride 03/17/2012 101  96 - 112 mEq/L Final  . CO2 03/17/2012 30  19 - 32 mEq/L Final  . Calcium 03/17/2012 9.4  8.4 - 10.5 mg/dL Final  . Albumin 14/78/2956 4.1  3.5 - 5.2 g/dL Final  . BUN 21/30/8657 11  6 - 23 mg/dL Final  . Creatinine, Ser 03/17/2012 0.6  0.4 - 1.2 mg/dL Final  . Glucose, Bld 84/69/6295 81  70 - 99 mg/dL Final  . Phosphorus 28/41/3244 3.5  2.3 - 4.6 mg/dL Final  . GFR 04/04/7251 101.45  >60.00 mL/min Final  . Total Bilirubin 03/17/2012 0.8  0.3 - 1.2 mg/dL Final  . Bilirubin, Direct 03/17/2012 0.0  0.0 - 0.3 mg/dL Final  . Alkaline Phosphatase 03/17/2012 89  39 - 117 U/L Final  . AST  03/17/2012 17  0 - 37 U/L Final  . ALT 03/17/2012 14  0 - 35 U/L Final  . Total Protein 03/17/2012 7.3  6.0 - 8.3 g/dL Final  . Albumin 66/44/0347 4.1  3.5 - 5.2 g/dL Final  . TSH 42/59/5638 0.53  0.35 - 5.50 uIU/mL Final     X-Rays:Dg Hip Complete Right  04/08/2012  *RADIOLOGY REPORT*  Clinical Data: Preop for hip replacement.  RIGHT HIP - COMPLETE 2+ VIEW  Comparison: CT of the abdomen and pelvis dated 02/01/2012  Findings: AP view of the pelvis and two views of the right hip were obtained.  Again noted is marked joint space narrowing along the superior aspect of the right hip joint.  There is subchondral sclerosis and subchondral cyst formations along the right superior hip joint.  Again noted is prominent spurring around the right femoral neck.  There is joint space narrowing and osteophyte formations in the left hip joint.  No evidence for acute fracture or dislocation.  IMPRESSION: Osteoarthritis in both hips, right side greater than left.  No acute bony abnormality.   Original Report Authenticated By: Richarda Overlie, M.D.    Dg Pelvis Portable  04/16/2012  *RADIOLOGY REPORT*  Clinical Data: Right hip pain.  PORTABLE PELVIS  Comparison: 04/08/2012.  Findings: Satisfactory appearance status post right total hip arthroplasty via anterior approach.  Satisfactory acetabular and femoral components.  Surgical drain good position.  IMPRESSION:  As above.   Original Report Authenticated By: Davonna Belling, M.D.    Dg Hip Portable 1 View Right  04/16/2012  *RADIOLOGY REPORT*  Clinical Data: Right hip osteoarthritis.  PORTABLE RIGHT HIP - 1 VIEW  Comparison: 04/08/2012.  Findings: Satisfactory appearance status post right total hip arthroplasty via anterior approach.  Satisfactory acetabular and femoral components.  Surgical drain good position.  IMPRESSION: As above.   Original Report Authenticated By: Davonna Belling, M.D.    Dg C-arm 1-60 Min-no Report  04/16/2012  CLINICAL DATA: anterior hip   C-ARM 1-60 MINUTES   Fluoroscopy was utilized by the requesting physician.  No radiographic  interpretation.      EKG: Orders placed in visit  on 03/17/12  . EKG 12-LEAD     Hospital Course: Patient was admitted to [facility] and taken to the OR and underwent the above state procedure without complications.  Patient tolerated the procedure well and was later transferred to the recovery room and then to the orthopaedic floor for postoperative care.  They were given PO and IV analgesics for pain control following their surgery.  They were given 24 hours of postoperative antibiotics of  Anti-infectives     Start     Dose/Rate Route Frequency Ordered Stop   04/16/12 1600   ceFAZolin (ANCEF) IVPB 1 g/50 mL premix        1 g 100 mL/hr over 30 Minutes Intravenous Every 6 hours 04/16/12 1459 04/16/12 2317   04/16/12 0614   ceFAZolin (ANCEF) IVPB 2 g/50 mL premix        2 g 100 mL/hr over 30 Minutes Intravenous 60 min pre-op 04/16/12 0614 04/16/12 0850         and started on DVT prophylaxis in the form of Xarelto.   PT and OT were ordered for total hip protocol.  The patient was allowed to be WBAT with therapy. Discharge planning was consulted to help with postop disposition and equipment needs.  Patient had a decent night on the evening of surgery and started to get up OOB with therapy on day one walking over 140 feet.  Hemovac drain was pulled without difficulty.  The knee immobilizer was removed and discontinued.  Continued to work with therapy into day two walking well.  Dressing was changed on day two and the incision was healing well.  By day three, the patient had progressed with therapy and meeting their goals.  Incision was healing well.  Patient was seen in rounds and was ready to go home.  Discharge Medications: Prior to Admission medications   Medication Sig Start Date End Date Taking? Authorizing Provider  ranitidine (ZANTAC) 150 MG tablet Take 150 mg by mouth 2 (two) times daily as needed. For  heartburn. 03/17/12  Yes Bradd Canary, MD  HYDROmorphone (DILAUDID) 2 MG tablet Take 1-2 tablets (2-4 mg total) by mouth every 4 (four) hours as needed. 04/19/12   Alexzandrew Perkins, PA  Magnesium Oxide 250 MG TABS Take 250 mg by mouth daily.    Historical Provider, MD  methocarbamol (ROBAXIN) 500 MG tablet Take 1 tablet (500 mg total) by mouth every 6 (six) hours as needed. 04/19/12   Alexzandrew Julien Girt, PA  rivaroxaban (XARELTO) 10 MG TABS tablet Take 1 tablet (10 mg total) by mouth daily with breakfast. Take Xarelto for two and a half more weeks, then discontinue Xarelto. Once the patient has completed the Xarelto, they may resume the Aspirin. 04/19/12   Alexzandrew Julien Girt, PA    Diet: Regular diet Activity:WBAT Follow-up:in 2 weeks Disposition - Home Discharged Condition: good   Discharge Orders    Future Appointments: Provider: Department: Dept Phone: Center:   05/28/2012 1:15 PM Bradd Canary, MD [facility] [place] [phone] None       Medication List     As of 04/19/2012 10:48 AM    STOP taking these medications         BAYER ADVANCED ASPIRIN EX ST 500 MG tablet   Generic drug: aspirin      GLUCOSAMINE CHONDROITIN COMPLX PO      MSM 1000 MG Tabs      NON FORMULARY      OVER THE COUNTER  MEDICATION      Vitamin D3 1000 UNITS Caps      TAKE these medications         HYDROmorphone 2 MG tablet   Commonly known as: DILAUDID   Take 1-2 tablets (2-4 mg total) by mouth every 4 (four) hours as needed.      Magnesium Oxide 250 MG Tabs   Take 250 mg by mouth daily.      methocarbamol 500 MG tablet   Commonly known as: ROBAXIN   Take 1 tablet (500 mg total) by mouth every 6 (six) hours as needed.      ranitidine 150 MG tablet   Commonly known as: ZANTAC   Take 150 mg by mouth 2 (two) times daily as needed. For heartburn.      rivaroxaban 10 MG Tabs tablet   Commonly known as: XARELTO   Take 1 tablet (10 mg total) by mouth daily with breakfast.  Take Xarelto for two and a half more weeks, then discontinue Xarelto.  Once the patient has completed the Xarelto, they may resume the Aspirin.           Follow-up Information    Follow up with Loanne Drilling, MD. Schedule an appointment as soon as possible for a visit in 2 weeks.   Contact information:   [address] [place] [address] [phone]          Signed: Patrica Duel 04/19/2012, 10:48 AM

## 2012-04-19 NOTE — Progress Notes (Signed)
Physical Therapy Treatment Patient Details Name: Michelle Romero MRN: [id number] DOB: 1955-08-01 Today's Date: 04/19/2012 Time: 1324-4010 PT Time Calculation (min): 18 min  PT Assessment / Plan / Recommendation Comments on Treatment Session  Pt progressing very well and is ready for D/C.     Follow Up Recommendations  Home health PT     Does the patient have the potential to tolerate intense rehabilitation     Barriers to Discharge        Equipment Recommendations  Rolling walker with 5" wheels    Recommendations for Other Services    Frequency 7X/week   Plan Discharge plan remains appropriate    Precautions / Restrictions Precautions Precautions: Fall Restrictions Weight Bearing Restrictions: No Other Position/Activity Restrictions: WBAT   Pertinent Vitals/Pain 3/10    Mobility  Bed Mobility Bed Mobility: Supine to Sit Supine to Sit: 4: Min guard Details for Bed Mobility Assistance: Min/guard for safety of RLE out of bed with cues for hand placement on bed to self assist.  Transfers Transfers: Sit to Stand;Stand to Sit Sit to Stand: 6: Modified independent (Device/Increase time) Stand to Sit: 6: Modified independent (Device/Increase time) Details for Transfer Assistance: increased time Ambulation/Gait Ambulation/Gait Assistance: 6: Modified independent (Device/Increase time) Ambulation Distance (Feet): 300 Feet Assistive device: Rolling walker Ambulation/Gait Assistance Details: Pt doing very well with step through gait pattern.  Gait Pattern: Step-through pattern Stairs: No    Exercises Total Joint Exercises Ankle Circles/Pumps: AROM;Both;20 reps Quad Sets: AROM;10 reps;Right Short Arc Quad: Strengthening;Right;10 reps Heel Slides: AAROM;Right;10 reps Hip ABduction/ADduction: AAROM;Right;10 reps Straight Leg Raises: AAROM;Right;10 reps   PT Diagnosis:    PT Problem List:   PT Treatment Interventions:     PT Goals Acute Rehab PT Goals PT Goal  Formulation: With patient Time For Goal Achievement: 04/22/12 Potential to Achieve Goals: Good Pt will go Supine/Side to Sit: with modified independence PT Goal: Supine/Side to Sit - Progress: Progressing toward goal Pt will go Sit to Stand: with modified independence PT Goal: Sit to Stand - Progress: Met Pt will go Stand to Sit: with modified independence PT Goal: Stand to Sit - Progress: Met Pt will Ambulate: 51 - 150 feet;with rolling walker;with modified independence PT Goal: Ambulate - Progress: Met  Visit Information  Last PT Received On: 04/19/12 Assistance Needed: +1    Subjective Data  Subjective: I'm ready to go home today. Patient Stated Goal: Resume previous lifestyle with decreased pain   Cognition  Overall Cognitive Status: Appears within functional limits for tasks assessed/performed Arousal/Alertness: Awake/alert Orientation Level: Appears intact for tasks assessed Behavior During Session: [name] for tasks performed    Balance     End of Session PT - End of Session Activity Tolerance: Patient tolerated treatment well Patient left: in chair;with call bell/phone within reach Nurse Communication: Mobility status   GP     Vista Deck 04/19/2012, 9:39 AM

## 2012-04-19 NOTE — Progress Notes (Signed)
In to see patient for treatment. The only question she had was for me to go verbally through how she would do the tub transfer with a tub bench, however she then proceeded to tell me how she would do it, which was correct. Only recommendation that I had was for her to put a towel down on the seat to help her slide in and out better. No further questions from patient, will sign off.

## 2012-05-28 ENCOUNTER — Ambulatory Visit: Payer: BC Managed Care – PPO | Admitting: Family Medicine

## 2012-05-30 ENCOUNTER — Ambulatory Visit: Payer: BC Managed Care – PPO | Admitting: Family Medicine

## 2012-06-27 ENCOUNTER — Encounter: Payer: Self-pay | Admitting: Family Medicine

## 2012-06-27 ENCOUNTER — Ambulatory Visit (INDEPENDENT_AMBULATORY_CARE_PROVIDER_SITE_OTHER): Payer: BC Managed Care – PPO | Admitting: Family Medicine

## 2012-06-27 VITALS — BP 130/71 | HR 75 | Temp 98.2°F | Ht 71.0 in | Wt 191.8 lb

## 2012-06-27 DIAGNOSIS — Z5189 Encounter for other specified aftercare: Secondary | ICD-10-CM

## 2012-06-27 DIAGNOSIS — K219 Gastro-esophageal reflux disease without esophagitis: Secondary | ICD-10-CM

## 2012-06-27 DIAGNOSIS — D649 Anemia, unspecified: Secondary | ICD-10-CM

## 2012-06-27 DIAGNOSIS — R030 Elevated blood-pressure reading, without diagnosis of hypertension: Secondary | ICD-10-CM

## 2012-06-27 DIAGNOSIS — D62 Acute posthemorrhagic anemia: Secondary | ICD-10-CM

## 2012-06-27 DIAGNOSIS — T7840XD Allergy, unspecified, subsequent encounter: Secondary | ICD-10-CM

## 2012-06-27 LAB — CBC
HCT: 36.2 % (ref 36.0–46.0)
Hemoglobin: 12 g/dL (ref 12.0–15.0)
Platelets: 264 10*3/uL (ref 150.0–400.0)
WBC: 6.9 10*3/uL (ref 4.5–10.5)

## 2012-06-27 NOTE — Patient Instructions (Addendum)

## 2012-06-28 ENCOUNTER — Encounter: Payer: Self-pay | Admitting: Family Medicine

## 2012-06-28 NOTE — Assessment & Plan Note (Signed)
No flare thus far this year. May use Zyrtec prn

## 2012-06-28 NOTE — Assessment & Plan Note (Signed)
Anemia resolved s/p surgery

## 2012-06-28 NOTE — Progress Notes (Signed)
Patient ID: Michelle Romero, female   DOB: 1955-10-22, 57 y.o.   MRN: [id number] JENE HUQ [id number] 04/05/1955 06/28/2012      Progress Note-Follow Up  Subjective  Chief Complaint  Chief Complaint  Patient presents with  . Follow-up    6 month    HPI  Patient is a yr-old Caucasian who's is in today for f/ushe's recently undergone a total hip approach is very happy with the results. Notes her pain is greatly improved and she is unable to return to horseback riding without difficulty. The following with orthopedics scheduled soon. Overall is doing well. Reflux is well controlled I taking ranitidine as needed. Denies headaches, chest pain, palpitations, shortness or breath, GI or GU concerns at today's visit.   Past Medical History  Diagnosis Date  . Vitamin D deficiency   . Chicken pox as a child  . Measles as a child  . 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 Hyperlipidemia   . Overweight   . Precancerous skin lesion 10/30/2011    Left abdomen, excised  . Viral infection 10/30/2011  . Vertigo 10/30/2011  . Elevated BP 11/30/2011  . Dupuytren's contracture of hand 01/30/2012  . Abdominal mass, right upper quadrant 01/30/2012  . Allergic state 01/30/2012  . Reflux 01/30/2012  . DJD (degenerative joint disease) of hip 03/17/2012  . PONV (postoperative nausea and vomiting)   . GERD (gastroesophageal reflux disease)   . History of blood transfusion     Past Surgical History  Procedure Laterality Date  . Colonoscopy  2010    [facility]  . Broke wrist  57 yrs old    plate placed by Dr Chaney Malling at [facility]  . Skin lesion excision      premelanoma on left abdominal wall  . Total hip arthroplasty  04/16/2012    Procedure: TOTAL HIP ARTHROPLASTY ANTERIOR APPROACH;  Surgeon: Loanne Drilling, MD;  Location: [facility];  Service: Orthopedics;  Laterality: Right;    Family History  Problem Relation Age of Onset  . Stroke Mother   . Hypertension Mother   . Diabetes Mother     type 2  . Dementia  Mother   . Cancer Father     melanoma- neck  . Arthritis Father   . Fibromyalgia Sister   . Cancer Maternal Grandmother     pancreatic  . Heart attack Maternal Grandfather   . Cancer Paternal Grandmother     ?  Marland Kitchen Heart disease Paternal Grandfather     pacemaker  . Cancer Sister 9    ovarian  . Arthritis Brother     History   Social History  . Marital Status: Married    Spouse Name: N/A    Number of Children: N/A  . Years of Education: N/A   Occupational History  . Not on file.   Social History Main Topics  . Smoking status: Never Smoker   . Smokeless tobacco: Never Used  . Alcohol Use: 0.5 oz/week    1 drink(s) per week     Comment: rarely  . Drug Use: No  . Sexually Active: Not Currently   Other Topics Concern  . Not on file   Social History Narrative  . No narrative on file    Current Outpatient Prescriptions on File Prior to Visit  Medication Sig Dispense Refill  . methocarbamol (ROBAXIN) 500 MG tablet Take 1 tablet (500 mg total) by mouth every 6 (six) hours as needed.  80 tablet  0  .  Magnesium Oxide 250 MG TABS Take 250 mg by mouth daily.      . ranitidine (ZANTAC) 150 MG tablet Take 150 mg by mouth 2 (two) times daily as needed. For heartburn.       No current facility-administered medications on file prior to visit.    Allergies  Allergen Reactions  . Codeine Other (See Comments)    Fainting  . Meloxicam     Vertigo, dizziness, sweating, chest tightness, diarrhea, dehydrated from being outside    Review of Systems  Review of Systems  Constitutional: Negative for fever and malaise/fatigue.  HENT: Negative for congestion.   Eyes: Negative for discharge.  Respiratory: Negative for shortness of breath.   Cardiovascular: Negative for chest pain, palpitations and leg swelling.  Gastrointestinal: Negative for nausea, abdominal pain and diarrhea.  Genitourinary: Negative for dysuria.  Musculoskeletal: Negative for falls.  Skin: Negative for  rash.  Neurological: Negative for loss of consciousness and headaches.  Endo/Heme/Allergies: Negative for polydipsia.  Psychiatric/Behavioral: Negative for depression and suicidal ideas. The patient is not nervous/anxious and does not have insomnia.     Objective  BP 130/71  Pulse 75  Temp(Src) 98.2 F (36.8 C) (Temporal)  Ht 5\' 11"  (1.803 m)  Wt 191 lb 12.8 oz (87 kg)  BMI 26.76 kg/m2  SpO2 98%  Physical Exam  Physical Exam  Constitutional: She is oriented to person, place, and time and well-developed, well-nourished, and in no distress. No distress.  HENT:  Head: Normocephalic and atraumatic.  Eyes: Conjunctivae are normal.  Neck: Neck supple. No thyromegaly present.  Cardiovascular: Normal rate, regular rhythm and normal heart sounds.   No murmur heard. Pulmonary/Chest: Effort normal and breath sounds normal. She has no wheezes.  Abdominal: She exhibits no distension and no mass.  Musculoskeletal: She exhibits no edema.  Lymphadenopathy:    She has no cervical adenopathy.  Neurological: She is alert and oriented to person, place, and time.  Skin: Skin is warm and dry. No rash noted. She is not diaphoretic.  Psychiatric: Memory, affect and judgment normal.    Lab Results  Component Value Date   TSH 0.53 03/17/2012   Lab Results  Component Value Date   WBC 6.9 06/27/2012   HGB 12.0 06/27/2012   HCT 36.2 06/27/2012   MCV 89.4 06/27/2012   PLT 264.0 06/27/2012   Lab Results  Component Value Date   CREATININE 0.52 04/18/2012   BUN 11 04/18/2012   NA 140 04/18/2012   K 3.8 04/18/2012   CL 104 04/18/2012   CO2 28 04/18/2012   Lab Results  Component Value Date   ALT 12 04/08/2012   AST 15 04/08/2012   ALKPHOS 107 04/08/2012   BILITOT 0.5 04/08/2012   Lab Results  Component Value Date   CHOL 223* 11/22/2011   Lab Results  Component Value Date   HDL 61.30 11/22/2011   No results found for this basename: [facility]   Lab Results  Component Value Date   TRIG 83.0 11/22/2011    Lab Results  Component Value Date   CHOLHDL 4 11/22/2011     Assessment & Plan Elevated BP Well controlled today, no concerns  Postop Acute blood loss anemia Anemia resolved s/p surgery  Allergic state No flare thus far this year. May use Zyrtec prn  Reflux Good response to dietary restrictions and Rantidine prn, may continue

## 2012-06-28 NOTE — Assessment & Plan Note (Signed)
Well controlled today, no concerns

## 2012-06-28 NOTE — Assessment & Plan Note (Signed)
Good response to dietary restrictions and Rantidine prn, may continue

## 2012-09-30 ENCOUNTER — Encounter: Payer: Self-pay | Admitting: Family Medicine

## 2012-09-30 ENCOUNTER — Ambulatory Visit (INDEPENDENT_AMBULATORY_CARE_PROVIDER_SITE_OTHER): Payer: BC Managed Care – PPO | Admitting: Family Medicine

## 2012-09-30 DIAGNOSIS — H60392 Other infective otitis externa, left ear: Secondary | ICD-10-CM

## 2012-09-30 DIAGNOSIS — R030 Elevated blood-pressure reading, without diagnosis of hypertension: Secondary | ICD-10-CM

## 2012-09-30 DIAGNOSIS — H6590 Unspecified nonsuppurative otitis media, unspecified ear: Secondary | ICD-10-CM

## 2012-09-30 DIAGNOSIS — H60399 Other infective otitis externa, unspecified ear: Secondary | ICD-10-CM | POA: Insufficient documentation

## 2012-09-30 DIAGNOSIS — T7840XA Allergy, unspecified, initial encounter: Secondary | ICD-10-CM

## 2012-09-30 MED ORDER — FLUTICASONE PROPIONATE 50 MCG/ACT NA SUSP: 2.0000 | Freq: Every day | NASAL | Status: DC | PRN

## 2012-09-30 MED ORDER — NEOMYCIN-POLYMYXIN-HC 3.5-10000-1 OT SOLN: 3.0000 [drp] | Freq: Three times a day (TID) | OTIC | Status: DC

## 2012-09-30 MED ORDER — CETIRIZINE HCL 10 MG PO TABS: 10.0000 mg | ORAL_TABLET | Freq: Every day | ORAL | Status: DC | PRN

## 2012-09-30 NOTE — Assessment & Plan Note (Signed)
Has trouble with otitis media and allergies this year every year. Is struggling with a lot of pressure and a sensation of fluid in her left ear. Benadryl helps temporarily. We'll start her on Zyrtec daily, Flonase daily and nasal saline as needed. Encouraged Valsalva maneuver and report if worsening symptoms or no improvement

## 2012-09-30 NOTE — Assessment & Plan Note (Signed)
Adequate control today, no changes 

## 2012-09-30 NOTE — Assessment & Plan Note (Signed)
Cortisporin Otic as prescribed

## 2012-09-30 NOTE — Patient Instructions (Addendum)
  Nasal saline daily and as needed Benadryl at night as needed. pressure Serous Otitis Media  Serous otitis media is also known as otitis media with effusion (OME). It means there is fluid in the middle ear space. This space contains the bones for hearing and air. Air in the middle ear space helps to transmit sound.  The air gets there through the eustachian tube. This tube goes from the back of the throat to the middle ear space. It keeps the pressure in the middle ear the same as the outside world. It also helps to drain fluid from the middle ear space. CAUSES  OME occurs when the eustachian tube gets blocked. Blockage can come from:  Ear infections.  Colds and other upper respiratory infections.  Allergies.  Irritants such as cigarette smoke.  Sudden changes in air pressure (such as descending in an airplane).  Enlarged adenoids. During colds and upper respiratory infections, the middle ear space can become temporarily filled with fluid. This can happen after an ear infection also. Once the infection clears, the fluid will generally drain out of the ear through the eustachian tube. If it does not, then OME occurs. SYMPTOMS   Hearing loss.  A feeling of fullness in the ear  but no pain.  Young children may not show any symptoms. DIAGNOSIS   Diagnosis of OME is made by an ear exam.  Tests may be done to check on the movement of the eardrum.  Hearing exams may be done. TREATMENT   The fluid most often goes away without treatment.  If allergy is the cause, allergy treatment may be helpful.  Fluid that persists for several months may require minor surgery. A small tube is placed in the ear drum to:  Drain the fluid.  Restore the air in the middle ear space.  In certain situations, antibiotics are used to avoid surgery.  Surgery may be done to remove enlarged adenoids (if this is the cause). HOME CARE INSTRUCTIONS   Keep children away from tobacco smoke.  Be sure to  keep follow up appointments, if any. SEEK MEDICAL CARE IF:   Hearing is not better in 3 months.  Hearing is worse.  Ear pain.  Drainage from the ear.  Dizziness. Document Released: 06/09/2003 Document Revised: 06/11/2011 Document Reviewed: 04/08/2008 [facility] Patient Information 2014 [place], [place].

## 2012-09-30 NOTE — Progress Notes (Signed)
Patient ID: BLONDINE HOTTEL, female   DOB: Nov 16, 1955, 57 y.o.   MRN: [id number] MERIE WULF [id number] Aug 30, 1955 09/30/2012      Progress Note-Follow Up  Subjective  Chief Complaint  Chief Complaint  Patient presents with  . fluid in both ears    left ear now- no pain    HPI  Patient is a 57 year old Caucasian female who is in today complaining of persistent fluid in her ears. She notes each time this year she has a sense of fluid in her ear is, some pressure and sense of decreased hearing. She has tried Benadryl when necessary with some reasonable results but symptoms keep returning. Denies fevers and chills but does have some mild nasal congestion and postnasal drip. Notes pruritus occasionally in her years in her nose. No ear pain..For discharge from ear. No headaches, chest pain, palpitations, chest congestion or shortness of breath. No GI or GU complaints  Past Medical History  Diagnosis Date  . Vitamin D deficiency   . Chicken pox as a child  . Measles as a child  . 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 Hyperlipidemia   . Overweight(278.02)   . Precancerous skin lesion 10/30/2011    Left abdomen, excised  . Viral infection 10/30/2011  . Vertigo 10/30/2011  . Elevated BP 11/30/2011  . Dupuytren's contracture of hand 01/30/2012  . Abdominal mass, right upper quadrant 01/30/2012  . Allergic state 01/30/2012  . Reflux 01/30/2012  . DJD (degenerative joint disease) of hip 03/17/2012  . PONV (postoperative nausea and vomiting)   . GERD (gastroesophageal reflux disease)   . History of blood transfusion   . Otitis, externa, infective 09/30/2012    left    Past Surgical History  Procedure Laterality Date  . Colonoscopy  2010    [facility]  . Broke wrist  57 yrs old    plate placed by Dr Chaney Malling at [facility]  . Skin lesion excision      premelanoma on left abdominal wall  . Total hip arthroplasty  04/16/2012    Procedure: TOTAL HIP ARTHROPLASTY ANTERIOR APPROACH;  Surgeon: Loanne Drilling, MD;  Location: [facility];  Service: Orthopedics;  Laterality: Right;    Family History  Problem Relation Age of Onset  . Stroke Mother   . Hypertension Mother   . Diabetes Mother     type 2  . Dementia Mother   . Cancer Father     melanoma- neck  . Arthritis Father   . Fibromyalgia Sister   . Cancer Maternal Grandmother     pancreatic  . Heart attack Maternal Grandfather   . Cancer Paternal Grandmother     ?  Marland Kitchen Heart disease Paternal Grandfather     pacemaker  . Cancer Sister 64    ovarian  . Arthritis Brother     History   Social History  . Marital Status: Married    Spouse Name: N/A    Number of Children: N/A  . Years of Education: N/A   Occupational History  . Not on file.   Social History Main Topics  . Smoking status: Never Smoker   . Smokeless tobacco: Never Used  . Alcohol Use: 0.5 oz/week    1 drink(s) per week     Comment: rarely  . Drug Use: No  . Sexually Active: Not Currently   Other Topics Concern  . Not on file   Social History Narrative  . No narrative on file  Current Outpatient Prescriptions on File Prior to Visit  Medication Sig Dispense Refill  . aspirin 500 MG tablet Take 500 mg by mouth as needed for pain.      . cholecalciferol (VITAMIN D) 1000 UNITS tablet Take 2,000 Units by mouth 2 (two) times daily.      Marland Kitchen OVER THE COUNTER MEDICATION Varigone- 1 tab tid      . ranitidine (ZANTAC) 150 MG tablet Take 150 mg by mouth 2 (two) times daily as needed. For heartburn.       No current facility-administered medications on file prior to visit.    Allergies  Allergen Reactions  . Codeine Other (See Comments)    Fainting  . Meloxicam     Vertigo, dizziness, sweating, chest tightness, diarrhea, dehydrated from being outside    Review of Systems  Review of Systems  Constitutional: Negative for fever and malaise/fatigue.  HENT: Negative for congestion.   Eyes: Negative for pain and discharge.  Respiratory: Negative for  shortness of breath.   Cardiovascular: Negative for chest pain, palpitations and leg swelling.  Gastrointestinal: Negative for nausea, abdominal pain and diarrhea.  Genitourinary: Negative for dysuria.  Musculoskeletal: Negative for falls.  Skin: Negative for rash.  Neurological: Negative for loss of consciousness and headaches.  Endo/Heme/Allergies: Negative for polydipsia.  Psychiatric/Behavioral: Negative for depression and suicidal ideas. The patient is not nervous/anxious and does not have insomnia.     Objective  BP 146/82  Pulse 82  Temp(Src) 98 F (36.7 C) (Oral)  Ht 5\' 11"  (1.803 m)  Wt 183 lb 0.6 oz (83.026 kg)  BMI 25.54 kg/m2  SpO2 96%  Physical Exam  Physical Exam  Lab Results  Component Value Date   TSH 0.53 03/17/2012   Lab Results  Component Value Date   WBC 6.9 06/27/2012   HGB 12.0 06/27/2012   HCT 36.2 06/27/2012   MCV 89.4 06/27/2012   PLT 264.0 06/27/2012   Lab Results  Component Value Date   CREATININE 0.52 04/18/2012   BUN 11 04/18/2012   NA 140 04/18/2012   K 3.8 04/18/2012   CL 104 04/18/2012   CO2 28 04/18/2012   Lab Results  Component Value Date   ALT 12 04/08/2012   AST 15 04/08/2012   ALKPHOS 107 04/08/2012   BILITOT 0.5 04/08/2012   Lab Results  Component Value Date   CHOL 223* 11/22/2011   Lab Results  Component Value Date   HDL 61.30 11/22/2011   No results found for this basename: [facility]   Lab Results  Component Value Date   TRIG 83.0 11/22/2011   Lab Results  Component Value Date   CHOLHDL 4 11/22/2011     Assessment & Plan  Allergic state Has trouble with otitis media and allergies this year every year. Is struggling with a lot of pressure and a sensation of fluid in her left ear. Benadryl helps temporarily. We'll start her on Zyrtec daily, Flonase daily and nasal saline as needed. Encouraged Valsalva maneuver and report if worsening symptoms or no improvement  Elevated BP Adequate control today, no changes  Otitis,  externa, infective Cortisporin Otic as prescribed

## 2012-12-18 ENCOUNTER — Other Ambulatory Visit (INDEPENDENT_AMBULATORY_CARE_PROVIDER_SITE_OTHER): Payer: BC Managed Care – PPO

## 2012-12-18 DIAGNOSIS — E785 Hyperlipidemia, unspecified: Secondary | ICD-10-CM

## 2012-12-18 DIAGNOSIS — R030 Elevated blood-pressure reading, without diagnosis of hypertension: Secondary | ICD-10-CM

## 2012-12-18 LAB — LIPID PANEL
HDL: 69.4 mg/dL (ref >39.00)
Total CHOL/HDL Ratio: 4
VLDL: 16.4 mg/dL (ref 0.0–40.0)

## 2012-12-18 LAB — HEPATIC FUNCTION PANEL
Bilirubin, Direct: 0.1 mg/dL (ref 0.0–0.3)
Total Bilirubin: 0.8 mg/dL (ref 0.3–1.2)
Total Protein: 6.2 g/dL (ref 6.0–8.3)

## 2012-12-18 LAB — CBC
HCT: 38.1 % (ref 36.0–46.0)
Platelets: 262 10*3/uL (ref 150.0–400.0)
RBC: 4.2 Mil/uL (ref 3.87–5.11)
WBC: 6.4 10*3/uL (ref 4.5–10.5)

## 2012-12-18 LAB — RENAL FUNCTION PANEL
Albumin: 3.9 g/dL (ref 3.5–5.2)
CO2: 29 mEq/L (ref 19–32)
Calcium: 9 mg/dL (ref 8.4–10.5)
Chloride: 105 mEq/L (ref 96–112)
Phosphorus: 3.7 mg/dL (ref 2.3–4.6)
Potassium: 4.3 mEq/L (ref 3.5–5.1)

## 2012-12-18 LAB — LDL CHOLESTEROL, DIRECT: Direct LDL: 162.9 mg/dL

## 2012-12-18 NOTE — Progress Notes (Signed)
Labs only

## 2012-12-25 ENCOUNTER — Telehealth: Payer: Self-pay | Admitting: Family Medicine

## 2012-12-25 ENCOUNTER — Encounter: Payer: Self-pay | Admitting: Family Medicine

## 2012-12-25 ENCOUNTER — Ambulatory Visit (INDEPENDENT_AMBULATORY_CARE_PROVIDER_SITE_OTHER): Payer: BC Managed Care – PPO | Admitting: Family Medicine

## 2012-12-25 VITALS — BP 114/82 | HR 68 | Temp 98.0°F | Ht 71.0 in | Wt 187.0 lb

## 2012-12-25 DIAGNOSIS — K649 Unspecified hemorrhoids: Secondary | ICD-10-CM

## 2012-12-25 DIAGNOSIS — E785 Hyperlipidemia, unspecified: Secondary | ICD-10-CM

## 2012-12-25 DIAGNOSIS — Z0000 Encounter for general adult medical examination without abnormal findings: Secondary | ICD-10-CM

## 2012-12-25 DIAGNOSIS — K219 Gastro-esophageal reflux disease without esophagitis: Secondary | ICD-10-CM

## 2012-12-25 DIAGNOSIS — R030 Elevated blood-pressure reading, without diagnosis of hypertension: Secondary | ICD-10-CM

## 2012-12-25 NOTE — Telephone Encounter (Signed)
Lab order week of 06-18-2013 Annual exam with labs prior to lipid, renal cbc, tsh hepatic

## 2012-12-25 NOTE — Progress Notes (Signed)
Patient ID: Michelle Romero, female   DOB: Sep 09, 1955, 57 y.o.   MRN: [id number] GERRICA CYGAN [id number] 09-11-1955 12/25/2012      Progress Note-Follow Up  Subjective  Chief Complaint  Chief Complaint  Patient presents with  . Follow-up    6 month    HPI  Patient is a 57 year old Caucasian female is here today in followup. She declines flu shot. She reports generally feeling well but does acknowledge she's been having some intermittent blood after bowel movements for the past month. Says she has episodes roughly every other day. Has to strain frequently. No blood on the stool or in the bowl. No blood between bowel movements. No abdominal pain, back pain, fevers, anorexia, nausea vomiting. No other acute complaints. No recent illness, congestion, shortness of breath, palpitations, chest pain. Has been trying to maintain a 50 heart healthy diet. Taking medications as prescribed  Past Medical History  Diagnosis Date  . Vitamin D deficiency   . Chicken pox as a child  . Measles as a child  . 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 Hyperlipidemia   . Overweight(278.02)   . Precancerous skin lesion 10/30/2011    Left abdomen, excised  . Viral infection 10/30/2011  . Vertigo 10/30/2011  . Elevated BP 11/30/2011  . Dupuytren's contracture of hand 01/30/2012  . Abdominal mass, right upper quadrant 01/30/2012  . Allergic state 01/30/2012  . Reflux 01/30/2012  . DJD (degenerative joint disease) of hip 03/17/2012  . PONV (postoperative nausea and vomiting)   . GERD (gastroesophageal reflux disease)   . History of blood transfusion   . Otitis, externa, infective 09/30/2012    left    Past Surgical History  Procedure Laterality Date  . Colonoscopy  2010    [facility]  . Broke wrist  57 yrs old    plate placed by Dr Chaney Malling at [facility]  . Skin lesion excision      premelanoma on left abdominal wall  . Total hip arthroplasty  04/16/2012    Procedure: TOTAL HIP ARTHROPLASTY ANTERIOR APPROACH;  Surgeon:  Loanne Drilling, MD;  Location: [facility];  Service: Orthopedics;  Laterality: Right;    Family History  Problem Relation Age of Onset  . Stroke Mother   . Hypertension Mother   . Diabetes Mother     type 2  . Dementia Mother   . Cancer Father     melanoma- neck  . Arthritis Father   . Fibromyalgia Sister   . Cancer Maternal Grandmother     pancreatic  . Heart attack Maternal Grandfather   . Cancer Paternal Grandmother     ?  Marland Kitchen Heart disease Paternal Grandfather     pacemaker  . Cancer Sister 18    ovarian  . Arthritis Brother     History   Social History  . Marital Status: Married    Spouse Name: N/A    Number of Children: N/A  . Years of Education: N/A   Occupational History  . Not on file.   Social History Main Topics  . Smoking status: Never Smoker   . Smokeless tobacco: Never Used  . Alcohol Use: 0.5 oz/week    1 drink(s) per week     Comment: rarely  . Drug Use: No  . Sexual Activity: Not Currently   Other Topics Concern  . Not on file   Social History Narrative  . No narrative on file    Current Outpatient Prescriptions on File  Prior to Visit  Medication Sig Dispense Refill  . aspirin 500 MG tablet Take 500 mg by mouth as needed for pain.      . cholecalciferol (VITAMIN D) 1000 UNITS tablet Take 2,000 Units by mouth 2 (two) times daily.       No current facility-administered medications on file prior to visit.    Allergies  Allergen Reactions  . Codeine Other (See Comments)    Fainting  . Meloxicam     Vertigo, dizziness, sweating, chest tightness, diarrhea, dehydrated from being outside    Review of Systems  Review of Systems  Constitutional: Positive for malaise/fatigue. Negative for fever.  HENT: Negative for congestion.   Eyes: Negative for discharge.  Respiratory: Negative for shortness of breath.   Cardiovascular: Negative for chest pain, palpitations and leg swelling.  Gastrointestinal: Positive for constipation and blood in  stool. Negative for nausea, abdominal pain and diarrhea.  Genitourinary: Negative for dysuria.  Musculoskeletal: Negative for falls.  Skin: Negative for rash.  Neurological: Negative for loss of consciousness and headaches.  Endo/Heme/Allergies: Negative for polydipsia.  Psychiatric/Behavioral: Negative for depression and suicidal ideas. The patient is not nervous/anxious and does not have insomnia.     Objective  BP 114/82  Pulse 68  Temp(Src) 98 F (36.7 C) (Oral)  Ht 5\' 11"  (1.803 m)  Wt 187 lb 0.6 oz (84.841 kg)  BMI 26.1 kg/m2  SpO2 97%  Physical Exam  Physical Exam  Constitutional: She is oriented to person, place, and time and well-developed, well-nourished, and in no distress. No distress.  HENT:  Head: Normocephalic and atraumatic.  Eyes: Conjunctivae are normal.  Neck: Neck supple. No thyromegaly present.  Cardiovascular: Normal rate, regular rhythm and normal heart sounds.   No murmur heard. Pulmonary/Chest: Effort normal and breath sounds normal. She has no wheezes.  Abdominal: She exhibits no distension and no mass.  Musculoskeletal: She exhibits no edema.  Lymphadenopathy:    She has no cervical adenopathy.  Neurological: She is alert and oriented to person, place, and time.  Skin: Skin is warm and dry. No rash noted. She is not diaphoretic.  Psychiatric: Memory, affect and judgment normal.    Lab Results  Component Value Date   TSH 1.86 12/18/2012   Lab Results  Component Value Date   WBC 6.4 12/18/2012   HGB 12.8 12/18/2012   HCT 38.1 12/18/2012   MCV 90.7 12/18/2012   PLT 262.0 12/18/2012   Lab Results  Component Value Date   CREATININE 0.6 12/18/2012   BUN 13 12/18/2012   NA 138 12/18/2012   K 4.3 12/18/2012   CL 105 12/18/2012   CO2 29 12/18/2012   Lab Results  Component Value Date   ALT 13 12/18/2012   AST 15 12/18/2012   ALKPHOS 83 12/18/2012   BILITOT 0.8 12/18/2012   Lab Results  Component Value Date   CHOL 246* 12/18/2012   Lab Results   Component Value Date   HDL 69.40 12/18/2012   No results found for this basename: [facility]   Lab Results  Component Value Date   TRIG 82.0 12/18/2012   Lab Results  Component Value Date   CHOLHDL 4 12/18/2012     Assessment & Plan  Elevated BP Well controlled today, minimize sodium  Hemorrhoids Has been having intermittent bright red blood after straining over this past month. Her stools have been somewhat hard for this occurs. She seen a scant amount of blood just on the tissue sometimes as often as  every other day. Encouraged probiotics stool softeners increase fluids and a probiotic if no improvement may need referral back to gastroenterology sooner than anticipated for further evaluation.  Hyperlipidemia Discussed options at length, will hold off on statins for now. Avoid trans fats, increase exercise, minimize simple carbs and saturated fats and restart a krill oil cap daily  Reflux Good response to zantac prn, add probiotic and avoid offending foods

## 2012-12-25 NOTE — Patient Instructions (Addendum)
Add a probiotic such as Digestive Advantage daily Add a fiber supplement daily 64 oz clear fluids Witch Hazel Astringent Add MegaRed caps daily Hemorrhoids Hemorrhoids are swollen veins around the rectum or anus. There are two types of hemorrhoids:   Internal hemorrhoids. These occur in the veins just inside the rectum. They may poke through to the outside and become irritated and painful.  External hemorrhoids. These occur in the veins outside the anus and can be felt as a painful swelling or hard lump near the anus. CAUSES  Pregnancy.   Obesity.   Constipation or diarrhea.   Straining to have a bowel movement.   Sitting for long periods on the toilet.  Heavy lifting or other activity that caused you to strain.  Anal intercourse. SYMPTOMS   Pain.   Anal itching or irritation.   Rectal bleeding.   Fecal leakage.   Anal swelling.   One or more lumps around the anus.  DIAGNOSIS  Your caregiver may be able to diagnose hemorrhoids by visual examination. Other examinations or tests that may be performed include:   Examination of the rectal area with a gloved hand (digital rectal exam).   Examination of anal canal using a small tube (scope).   A blood test if you have lost a significant amount of blood.  A test to look inside the colon (sigmoidoscopy or colonoscopy). TREATMENT Most hemorrhoids can be treated at home. However, if symptoms do not seem to be getting better or if you have a lot of rectal bleeding, your caregiver may perform a procedure to help make the hemorrhoids get smaller or remove them completely. Possible treatments include:   Placing a rubber band at the base of the hemorrhoid to cut off the circulation (rubber band ligation).   Injecting a chemical to shrink the hemorrhoid (sclerotherapy).   Using a tool to burn the hemorrhoid (infrared light therapy).   Surgically removing the hemorrhoid (hemorrhoidectomy).   Stapling the  hemorrhoid to block blood flow to the tissue (hemorrhoid stapling).  HOME CARE INSTRUCTIONS   Eat foods with fiber, such as whole grains, beans, nuts, fruits, and vegetables. Ask your doctor about taking products with added fiber in them (fibersupplements).  Increase fluid intake. Drink enough water and fluids to keep your urine clear or pale yellow.   Exercise regularly.   Go to the bathroom when you have the urge to have a bowel movement. Do not wait.   Avoid straining to have bowel movements.   Keep the anal area dry and clean. Use wet toilet paper or moist towelettes after a bowel movement.   Medicated creams and suppositories may be used or applied as directed.   Only take over-the-counter or prescription medicines as directed by your caregiver.   Take warm sitz baths for 15 20 minutes, 3 4 times a day to ease pain and discomfort.   Place ice packs on the hemorrhoids if they are tender and swollen. Using ice packs between sitz baths may be helpful.   Put ice in a plastic bag.   Place a towel between your skin and the bag.   Leave the ice on for 15 20 minutes, 3 4 times a day.   Do not use a donut-shaped pillow or sit on the toilet for long periods. This increases blood pooling and pain.  SEEK MEDICAL CARE IF:  You have increasing pain and swelling that is not controlled by treatment or medicine.  You have uncontrolled bleeding.  You have difficulty  or you are unable to have a bowel movement.  You have pain or inflammation outside the area of the hemorrhoids. MAKE SURE YOU:  Understand these instructions.  Will watch your condition.  Will get help right away if you are not doing well or get worse. Document Released: 03/16/2000 Document Revised: 03/05/2012 Document Reviewed: 01/22/2012 [facility] Patient Information 2014 [place].

## 2012-12-28 ENCOUNTER — Encounter: Payer: Self-pay | Admitting: Family Medicine

## 2012-12-28 DIAGNOSIS — K649 Unspecified hemorrhoids: Secondary | ICD-10-CM | POA: Insufficient documentation

## 2012-12-28 HISTORY — DX: Unspecified hemorrhoids: K64.9

## 2012-12-28 NOTE — Assessment & Plan Note (Signed)
Has been having intermittent bright red blood after straining over this past month. Her stools have been somewhat hard for this occurs. She seen a scant amount of blood just on the tissue sometimes as often as every other day. Encouraged probiotics stool softeners increase fluids and a probiotic if no improvement may need referral back to gastroenterology sooner than anticipated for further evaluation.

## 2012-12-28 NOTE — Assessment & Plan Note (Signed)
Good response to zantac prn, add probiotic and avoid offending foods

## 2012-12-28 NOTE — Assessment & Plan Note (Signed)
Well controlled today, minimize sodium

## 2012-12-28 NOTE — Assessment & Plan Note (Signed)
Discussed options at length, will hold off on statins for now. Avoid trans fats, increase exercise, minimize simple carbs and saturated fats and restart a krill oil cap daily

## 2013-06-25 ENCOUNTER — Encounter: Payer: BC Managed Care – PPO | Admitting: Family Medicine

## 2013-07-08 ENCOUNTER — Other Ambulatory Visit: Payer: Self-pay

## 2013-07-08 DIAGNOSIS — Z1231 Encounter for screening mammogram for malignant neoplasm of breast: Secondary | ICD-10-CM

## 2013-07-17 ENCOUNTER — Ambulatory Visit
Admission: RE | Admit: 2013-07-17 | Discharge: 2013-07-17 | Disposition: A | Discharge status: Home / Self Care | Payer: BC Managed Care – PPO | Source: Ambulatory Visit

## 2013-07-17 DIAGNOSIS — Z1231 Encounter for screening mammogram for malignant neoplasm of breast: Secondary | ICD-10-CM

## 2013-08-17 ENCOUNTER — Encounter: Payer: Self-pay | Admitting: Family

## 2013-08-17 ENCOUNTER — Ambulatory Visit (INDEPENDENT_AMBULATORY_CARE_PROVIDER_SITE_OTHER): Payer: BC Managed Care – PPO | Admitting: Family

## 2013-08-17 VITALS — BP 134/90 | HR 63 | Temp 98.0°F | Resp 16 | Ht 71.0 in | Wt 190.0 lb

## 2013-08-17 DIAGNOSIS — L538 Other specified erythematous conditions: Secondary | ICD-10-CM

## 2013-08-17 DIAGNOSIS — L304 Erythema intertrigo: Secondary | ICD-10-CM | POA: Insufficient documentation

## 2013-08-17 MED ORDER — NYSTATIN 100000 UNIT/GM EX CREA: 1.0000 "application " | TOPICAL_CREAM | Freq: Two times a day (BID) | CUTANEOUS | Status: DC

## 2013-08-17 NOTE — Assessment & Plan Note (Signed)
Trial of nystatin cream. Follow up if symptoms worsen or do not improve.

## 2013-08-17 NOTE — Progress Notes (Signed)
Pre visit review using our clinic review tool, if applicable. No additional management support is needed unless otherwise documented below in the visit note. 

## 2013-08-17 NOTE — Patient Instructions (Signed)
Apply nystatin cream to affected area twice daily until healed.   Call if symptoms worsen or if not resolved in 2 weeks.

## 2013-08-17 NOTE — Progress Notes (Signed)
Subjective:    Patient ID: Michelle Romero, female    DOB: 01-29-1956, 58 y.o.   MRN: [id number]  HPI  Michelle Romero is a 58 yr old female who presents today with chief complaint of rash. Reports that she sweats a lot when she horse back rides.  Has been present x 1 week.  Mild associated pruritis, worsens with sweating.    Review of Systems See HPI  Past Medical History  Diagnosis Date  . Vitamin D deficiency   . Chicken pox as a child  . Measles as a child  . 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 Hyperlipidemia   . Overweight   . Precancerous skin lesion 10/30/2011    Left abdomen, excised  . Viral infection 10/30/2011  . Vertigo 10/30/2011  . Elevated BP 11/30/2011  . Dupuytren's contracture of hand 01/30/2012  . Abdominal mass, right upper quadrant 01/30/2012  . Allergic state 01/30/2012  . Reflux 01/30/2012  . DJD (degenerative joint disease) of hip 03/17/2012  . PONV (postoperative nausea and vomiting)   . GERD (gastroesophageal reflux disease)   . History of blood transfusion   . Otitis, externa, infective 09/30/2012    left  . Hemorrhoids 12/28/2012    History   Social History  . Marital Status: Married    Spouse Name: N/A    Number of Children: N/A  . Years of Education: N/A   Occupational History  . Not on file.   Social History Main Topics  . Smoking status: Never Smoker   . Smokeless tobacco: Never Used  . Alcohol Use: 0.5 oz/week    1 drink(s) per week     Comment: rarely  . Drug Use: No  . Sexual Activity: Not Currently   Other Topics Concern  . Not on file   Social History Narrative  . No narrative on file    Past Surgical History  Procedure Laterality Date  . Colonoscopy  2010    [facility]  . Broke wrist  58 yrs old    plate placed by Dr Alphonzo Cruise at [facility]  . Skin lesion excision      premelanoma on left abdominal wall  . 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 Stroke Mother   . Hypertension Mother   . Diabetes Mother     type 2  . Dementia Mother   . Cancer Father     melanoma- neck  . Arthritis Father   . Fibromyalgia Sister   . Cancer Maternal Grandmother     pancreatic  . Heart attack Maternal Grandfather   . Cancer Paternal Grandmother     ?  Marland Kitchen Heart disease Paternal Grandfather     pacemaker  . Cancer Sister 53    ovarian  . Arthritis Brother     Allergies  Allergen Reactions  . Codeine Other (See Comments)    Fainting  . Meloxicam     Vertigo, dizziness, sweating, chest tightness, diarrhea, dehydrated from being outside    Current Outpatient Prescriptions on File Prior to Visit  Medication Sig Dispense Refill  . aspirin 500 MG tablet Take 500 mg by mouth as needed for pain.      . cholecalciferol (VITAMIN D) 1000 UNITS tablet Take 1,000 Units by mouth every other  day.       Marland Kitchen OVER THE COUNTER MEDICATION Trace mineral supplement      . OVER THE COUNTER MEDICATION Verigon.  horsechestnut       No current facility-administered medications on file prior to visit.    BP 134/90  Pulse 63  Temp(Src) 98 F (36.7 C) (Oral)  Resp 16  Ht 5\' 11"  (1.803 m)  Wt 190 lb (86.183 kg)  BMI 26.51 kg/m2  SpO2 97%       Objective:   Physical Exam  Constitutional: She is oriented to person, place, and time. She appears well-developed and well-nourished. No distress.  HENT:  Head: Normocephalic and atraumatic.  Cardiovascular: Normal rate and regular rhythm.   No murmur heard. Pulmonary/Chest: Effort normal and breath sounds normal. No respiratory distress. She has no wheezes. She has no rales. She exhibits no tenderness.  Neurological: She is alert and oriented to person, place, and time.  Skin:  Red annular rash noted right groin  Psychiatric: She has a normal mood and affect. Her behavior is normal. Judgment  and thought content normal.          Assessment & Plan:

## 2013-08-27 ENCOUNTER — Encounter: Payer: Self-pay | Admitting: Family Medicine

## 2013-08-27 ENCOUNTER — Ambulatory Visit (INDEPENDENT_AMBULATORY_CARE_PROVIDER_SITE_OTHER): Payer: BC Managed Care – PPO | Admitting: Family Medicine

## 2013-08-27 VITALS — BP 122/80 | HR 78 | Temp 98.5°F | Ht 71.0 in | Wt 189.0 lb

## 2013-08-27 DIAGNOSIS — K219 Gastro-esophageal reflux disease without esophagitis: Secondary | ICD-10-CM

## 2013-08-27 DIAGNOSIS — R030 Elevated blood-pressure reading, without diagnosis of hypertension: Secondary | ICD-10-CM

## 2013-08-27 DIAGNOSIS — E663 Overweight: Secondary | ICD-10-CM

## 2013-08-27 DIAGNOSIS — B354 Tinea corporis: Secondary | ICD-10-CM

## 2013-08-27 DIAGNOSIS — IMO0001 Reserved for inherently not codable concepts without codable children: Secondary | ICD-10-CM

## 2013-08-27 MED ORDER — FLUCONAZOLE 150 MG PO TABS: ORAL_TABLET | ORAL | Status: DC

## 2013-08-27 NOTE — Patient Instructions (Signed)
Blow dry and Witch Hazel Astringent  Body Ringworm Ringworm (tinea corporis) is a fungal infection of the skin on the body. This infection is not caused by worms, but is actually caused by a fungus. Fungus normally lives on the top of your skin and can be useful. However, in the case of ringworms, the fungus grows out of control and causes a skin infection. It can involve any area of skin on the body and can spread easily from one person to another (contagious). Ringworm is a common problem for children, but it can affect adults as well. Ringworm is also often found in athletes, especially wrestlers who share equipment and mats.  CAUSES  Ringworm of the body is caused by a fungus called dermatophyte. It can spread by:  Touchingother people who are infected.  Touchinginfected pets.  Touching or sharingobjects that have been in contact with the infected person or pet (hats, combs, towels, clothing, sports equipment). SYMPTOMS   Itchy, raised red spots and bumps on the skin.  Ring-shaped rash.  Redness near the border of the rash with a clear center.  Dry and scaly skin on or around the rash. Not every person develops a ring-shaped rash. Some develop only the red, scaly patches. DIAGNOSIS  Most often, ringworm can be diagnosed by performing a skin exam. Your caregiver may choose to take a skin scraping from the affected area. The sample will be examined under the microscope to see if the fungus is present.  TREATMENT  Body ringworm may be treated with a topical antifungal cream or ointment. Sometimes, an antifungal shampoo that can be used on your body is prescribed. You may be prescribed antifungal medicines to take by mouth if your ringworm is severe, keeps coming back, or lasts a long time.  HOME CARE INSTRUCTIONS   Only take over-the-counter or prescription medicines as directed by your caregiver.  Wash the infected area and dry it completely before applying yourcream or  ointment.  When using antifungal shampoo to treat the ringworm, leave the shampoo on the body for 3 5 minutes before rinsing.   Wear loose clothing to stop clothes from rubbing and irritating the rash.  Wash or change your bed sheets every night while you have the rash.  Have your pet treated by your veterinarian if it has the same infection. To prevent ringworm:   Practice good hygiene.  Wear sandals or shoes in public places and showers.  Do not share personal items with others.  Avoid touching red patches of skin on other people.  Avoid touching pets that have bald spots or wash your hands after doing so. SEEK MEDICAL CARE IF:   Your rash continues to spread after 7 days of treatment.  Your rash is not gone in 4 weeks.  The area around your rash becomes red, warm, tender, and swollen. Document Released: 03/16/2000 Document Revised: 12/12/2011 Document Reviewed: 10/01/2011 [facility] Patient Information 2014 [place].

## 2013-08-27 NOTE — Progress Notes (Signed)
Pre visit review using our clinic review tool, if applicable. No additional management support is needed unless otherwise documented below in the visit note. 

## 2013-08-30 ENCOUNTER — Encounter: Payer: Self-pay | Admitting: Family Medicine

## 2013-08-30 DIAGNOSIS — B354 Tinea corporis: Secondary | ICD-10-CM | POA: Insufficient documentation

## 2013-08-30 NOTE — Progress Notes (Signed)
Patient ID: Michelle Romero, female   DOB: 1955-12-04, 58 y.o.   MRN: [id number] Michelle Romero [id number] 1955/04/14 08/30/2013      Progress Note-Follow Up  Subjective  Chief Complaint  Chief Complaint  Patient presents with  . Follow-up    rash in groin area- itchy at times, not any better since seeing Lenna Sciara    HPI  Patient is a 58 year old female in today for routine medical care. He is in today to have her rash looked at. It is mildly itchy. It is improving. She has been using nystatin cream twice daily. Itching is resolved. No other acute complaints. Denies CP/palp/SOB/HA/congestion/fevers/GI or GU c/o. Taking meds as prescribed  Past Medical History  Diagnosis Date  . Vitamin D deficiency   . Chicken pox as a child  . Measles as a child  . 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 Hyperlipidemia   . Overweight   . Precancerous skin lesion 10/30/2011    Left abdomen, excised  . Viral infection 10/30/2011  . Vertigo 10/30/2011  . Elevated BP 11/30/2011  . Dupuytren's contracture of hand 01/30/2012  . Abdominal mass, right upper quadrant 01/30/2012  . Allergic state 01/30/2012  . Reflux 01/30/2012  . DJD (degenerative joint disease) of hip 03/17/2012  . PONV (postoperative nausea and vomiting)   . GERD (gastroesophageal reflux disease)   . History of blood transfusion   . Otitis, externa, infective 09/30/2012    left  . Hemorrhoids 12/28/2012    Past Surgical History  Procedure Laterality Date  . Colonoscopy  2010    [facility]  . Broke wrist  58 yrs old    plate placed by Dr Alphonzo Cruise at [facility]  . Skin lesion excision      premelanoma on left abdominal wall  . 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 Stroke Mother   . Hypertension Mother   . Diabetes Mother     type 2  . Dementia  Mother   . Cancer Father     melanoma- neck  . Arthritis Father   . Fibromyalgia Sister   . Cancer Maternal Grandmother     pancreatic  . Heart attack Maternal Grandfather   . Cancer Paternal Grandmother     ?  Marland Kitchen Heart disease Paternal Grandfather     pacemaker  . Cancer Sister 55    ovarian  . Arthritis Brother     History   Social History  . Marital Status: Married    Spouse Name: N/A    Number of Children: N/A  . Years of Education: N/A   Occupational History  . Not on file.   Social History Main Topics  . Smoking status: Never Smoker   . Smokeless tobacco: Never Used  . Alcohol Use: 0.5 oz/week    1 drink(s) per week     Comment: rarely  . Drug Use: No  . Sexual Activity: Not Currently   Other Topics Concern  . Not on file   Social History Narrative  . No narrative on file    Current Outpatient Prescriptions on File Prior to Visit  Medication Sig Dispense Refill  . aspirin 500 MG tablet Take 500 mg by mouth as needed for pain.      . cholecalciferol (  VITAMIN D) 1000 UNITS tablet Take 1,000 Units by mouth every other day.       . nystatin cream (MYCOSTATIN) Apply 1 application topically 2 (two) times daily.  30 g  1  . OVER THE COUNTER MEDICATION Trace mineral supplement      . OVER THE COUNTER MEDICATION Verigon.  horsechestnut       No current facility-administered medications on file prior to visit.    Allergies  Allergen Reactions  . Codeine Other (See Comments)    Fainting  . Meloxicam     Vertigo, dizziness, sweating, chest tightness, diarrhea, dehydrated from being outside    Review of Systems  Review of Systems  Constitutional: Negative for fever and malaise/fatigue.  HENT: Negative for congestion.   Eyes: Negative for discharge.  Respiratory: Negative for shortness of breath.   Cardiovascular: Negative for chest pain, palpitations and leg swelling.  Gastrointestinal: Negative for nausea, abdominal pain and diarrhea.  Genitourinary:  Negative for dysuria.  Musculoskeletal: Negative for falls.  Skin: Positive for itching and rash.  Neurological: Negative for loss of consciousness and headaches.  Endo/Heme/Allergies: Negative for polydipsia.  Psychiatric/Behavioral: Negative for depression and suicidal ideas. The patient is not nervous/anxious and does not have insomnia.     Objective  BP 122/80  Pulse 78  Temp(Src) 98.5 F (36.9 C) (Oral)  Ht 5\' 11"  (1.803 m)  Wt 189 lb (85.73 kg)  BMI 26.37 kg/m2  SpO2 96%  Physical Exam  Physical Exam  Constitutional: She is oriented to person, place, and time and well-developed, well-nourished, and in no distress. No distress.  HENT:  Head: Normocephalic and atraumatic.  Eyes: Conjunctivae are normal.  Neck: Neck supple. No thyromegaly present.  Cardiovascular: Normal rate, regular rhythm and normal heart sounds.   No murmur heard. Pulmonary/Chest: Effort normal and breath sounds normal. She has no wheezes.  Abdominal: Soft. Bowel sounds are normal. She exhibits no distension and no mass.  Musculoskeletal: She exhibits no edema.  Lymphadenopathy:    She has no cervical adenopathy.  Neurological: She is alert and oriented to person, place, and time.  Skin: Skin is warm and dry. Rash noted. She is not diaphoretic.  Erythematous rash with scaly skin in groin on right. Darker around edge, oval shaped.   Psychiatric: Memory, affect and judgment normal.    Lab Results  Component Value Date   TSH 1.86 12/18/2012   Lab Results  Component Value Date   WBC 6.4 12/18/2012   HGB 12.8 12/18/2012   HCT 38.1 12/18/2012   MCV 90.7 12/18/2012   PLT 262.0 12/18/2012   Lab Results  Component Value Date   CREATININE 0.6 12/18/2012   BUN 13 12/18/2012   NA 138 12/18/2012   K 4.3 12/18/2012   CL 105 12/18/2012   CO2 29 12/18/2012   Lab Results  Component Value Date   ALT 13 12/18/2012   AST 15 12/18/2012   ALKPHOS 83 12/18/2012   BILITOT 0.8 12/18/2012   Lab Results  Component  Value Date   CHOL 246* 12/18/2012   Lab Results  Component Value Date   HDL 69.40 12/18/2012   No results found for this basename: [facility]   Lab Results  Component Value Date   TRIG 82.0 12/18/2012   Lab Results  Component Value Date   CHOLHDL 4 12/18/2012     Assessment & Plan  Elevated BP Well controlled. Encouraged heart healthy diet such as the DASH diet and exercise as tolerated.   Reflux  Avoid offending foods, start probiotics. Do not eat large meals in late evening and consider raising head of bed.   Overweight Encouraged DASH diet, decrease po intake and increase exercise as tolerated. Needs 7-8 hours of sleep nightly. Avoid trans fats, eat small, frequent meals every 4-5 hours with lean proteins, complex carbs and healthy fats. Minimize simple carbs, GMO foods.  Tinea corporis In groin. Given Diflucan and Nystatin cream

## 2013-08-30 NOTE — Assessment & Plan Note (Signed)
In groin. Given Diflucan and Nystatin cream

## 2013-08-30 NOTE — Assessment & Plan Note (Signed)
Avoid offending foods, start probiotics. Do not eat large meals in late evening and consider raising head of bed.  

## 2013-08-30 NOTE — Assessment & Plan Note (Signed)
Encouraged DASH diet, decrease po intake and increase exercise as tolerated. Needs 7-8 hours of sleep nightly. Avoid trans fats, eat small, frequent meals every 4-5 hours with lean proteins, complex carbs and healthy fats. Minimize simple carbs, GMO foods. 

## 2013-08-30 NOTE — Assessment & Plan Note (Signed)
Well controlled. Encouraged heart healthy diet such as the DASH diet and exercise as tolerated.  

## 2014-03-31 ENCOUNTER — Ambulatory Visit: Payer: BC Managed Care – PPO

## 2014-03-31 ENCOUNTER — Encounter: Payer: Self-pay | Admitting: Family Medicine

## 2014-03-31 ENCOUNTER — Ambulatory Visit (INDEPENDENT_AMBULATORY_CARE_PROVIDER_SITE_OTHER): Payer: BC Managed Care – PPO | Admitting: Family Medicine

## 2014-03-31 VITALS — BP 145/84 | HR 67 | Temp 98.6°F | Resp 18 | Ht 71.0 in | Wt 194.0 lb

## 2014-03-31 DIAGNOSIS — R229 Localized swelling, mass and lump, unspecified: Secondary | ICD-10-CM

## 2014-03-31 DIAGNOSIS — R21 Rash and other nonspecific skin eruption: Secondary | ICD-10-CM

## 2014-03-31 NOTE — Progress Notes (Signed)
OFFICE NOTE  03/31/2014  CC:  Chief Complaint  Patient presents with  . Leg Problem    Left leg  . Rash    Left foot    HPI: Patient is a 58 y.o. Caucasian female who is here for left foot rash x 1 mo.  It itches sometimes, esp when foot hot/sweaty. Tried otc lamisil cream for the last 10d and she does not think the appearance has changed but she feels like the itching is better.    Also felt a slight bulge in area of lateral aspect of left thigh beneath skin in her subQ fat.  Nontender, has felt it a day or two, is worried it may be a blood clot.  Has no swelling of her LE's.   No personal RF's for thrombosis.  Her mom has had a DVT.  Pertinent PMH:  PMH and PSH reviewed.  MEDS:  ASA and vit D, otc mineral supp, otc horsechestnut  PE: Blood pressure 145/84, pulse 67, temperature 98.6 F (37 C), temperature source Temporal, resp. rate 18, height 5\' 11"  (1.803 m), weight 194 lb (87.998 kg), SpO2 98 %. Gen: Alert, well appearing.  Patient is oriented to person, place, time, and situation. SKIN: left foot lateral aspect, involving 4th and 5th toes there is an incomplete annular shaped erythematous rash with some papular aspect to it, it does blanch to touch and is nontender.  There is the appearance of central clearing.  Borders not raised/palpable. Left thigh lateral aspect where pt feels a subQ nodular lesion is without any abnormal feeling tissue upon my palpation.  I feel normal globules of subQ fat.   She has some varicosities scattered over the thighs and LL's, no inflammation noted.  IMPRESSION AND PLAN:  1) Tinea cruris vs granuloma annulare or erythema annulare. I think she likely has tinea that she simply needs to treat longer--recommended she continue OTC lamisil cream bid.  2) Left lateral thigh complaint of subQ fat nodule: I cannot appreciate any such abnormality on exam today. Reassured pt today. Signs/symptoms to call or return for were reviewed and pt expressed  understanding.  An After Visit Summary was printed and given to the patient.  FOLLOW UP: prn

## 2014-03-31 NOTE — Progress Notes (Signed)
Pre visit review using our clinic review tool, if applicable. No additional management support is needed unless otherwise documented below in the visit note. 

## 2014-10-29 ENCOUNTER — Telehealth: Payer: Self-pay | Admitting: Medical

## 2014-10-29 ENCOUNTER — Encounter: Payer: Self-pay | Admitting: Medical

## 2014-10-29 ENCOUNTER — Ambulatory Visit (INDEPENDENT_AMBULATORY_CARE_PROVIDER_SITE_OTHER): Payer: BLUE CROSS/BLUE SHIELD | Admitting: Medical

## 2014-10-29 VITALS — BP 131/72 | HR 82 | Temp 98.0°F | Ht 71.0 in | Wt 192.6 lb

## 2014-10-29 DIAGNOSIS — R059 Cough, unspecified: Secondary | ICD-10-CM

## 2014-10-29 DIAGNOSIS — R0981 Nasal congestion: Secondary | ICD-10-CM

## 2014-10-29 DIAGNOSIS — J208 Acute bronchitis due to other specified organisms: Secondary | ICD-10-CM

## 2014-10-29 DIAGNOSIS — R05 Cough: Secondary | ICD-10-CM | POA: Diagnosis not present

## 2014-10-29 MED ORDER — AZITHROMYCIN 250 MG PO TABS: ORAL_TABLET | ORAL | Status: DC

## 2014-10-29 MED ORDER — FLUTICASONE PROPIONATE 50 MCG/ACT NA SUSP: 2.0000 | Freq: Every day | NASAL | Status: DC

## 2014-10-29 MED ORDER — BENZONATATE 100 MG PO CAPS: 100.0000 mg | ORAL_CAPSULE | Freq: Three times a day (TID) | ORAL | Status: DC | PRN

## 2014-10-29 NOTE — Progress Notes (Signed)
Pre visit review using our clinic review tool, if applicable. No additional management support is needed unless otherwise documented below in the visit note. 

## 2014-10-29 NOTE — Progress Notes (Signed)
   Subjective:    Patient ID: Michelle Romero, female    DOB: February 02, 1956, 59 y.o.   MRN: [id number]  HPI   Pt in with cough since Sunday. Pt son was sick before she was. Pt feels nasal congestion. Sneezing and some itching eyes. Some fever and chills subjective. Some sweats. No sinus pressure. No productive cough. Non smoker. Very rare bronchitis hx.      Review of Systems  Constitutional: Negative for chills and fatigue.  HENT: Positive for congestion and sneezing.   Eyes: Positive for itching.  Respiratory: Positive for cough. Negative for chest tightness, shortness of breath and wheezing.   Cardiovascular: Negative for chest pain and palpitations.  Musculoskeletal: Negative for back pain.  Neurological: Negative for dizziness and headaches.  Hematological: Negative for adenopathy. Does not bruise/bleed easily.  Psychiatric/Behavioral: Negative for behavioral problems and confusion.       Objective:   Physical Exam   General  Mental Status - Alert. General Appearance - Well groomed. Not in acute distress.  Skin Rashes- No Rashes.  HEENT Head- Normal. Ear Auditory Canal - Left- Normal. Right - Normal.Tympanic Membrane- Left- Normal. Right- Normal. Eye Sclera/Conjunctiva- Left- Normal. Right- Normal. Nose & Sinuses Nasal Mucosa- Left-  Boggy and Congested. Right-  Boggy and  Congested.Bilateral  No  maxillary and  No frontal sinus pressure. Mouth & Throat Lips: Upper Lip- Normal: no dryness, cracking, pallor, cyanosis, or vesicular eruption. Lower Lip-Normal: no dryness, cracking, pallor, cyanosis or vesicular eruption. Buccal Mucosa- Bilateral- No Aphthous ulcers. Oropharynx- No Discharge or Erythema. Tonsils: Characteristics- Bilateral- No Erythema or Congestion. Size/Enlargement- Bilateral- No enlargement. Discharge- bilateral-None.  Neck Neck- Supple. No Masses.   Chest and Lung Exam Auscultation: Breath Sounds:-even and unlabored. Faint upper lobe  rhonchi.  Cardiovascular Auscultation:Rythm- Regular, rate and rhythm. Murmurs & Other Heart Sounds:Ausculatation of the heart reveal- No Murmurs.  Lymphatic Head & Neck General Head & Neck Lymphatics: Bilateral: Description- No Localized lymphadenopathy.      Assessment & Plan:  Possible bronchitis following nasal congestion and cough(which could be allergy related)  Rx azithromycin, benzonatate and flonase.  Follow up in 7 days or as needed

## 2014-10-29 NOTE — Progress Notes (Signed)
This encounter was created in error - please disregard.

## 2014-10-29 NOTE — Patient Instructions (Addendum)
Possible bronchitis following nasal congestion and cough(which could be allergy related)  Rx azithromycin, benzonatate and flonase.  Follow up in 7 days or as needed

## 2014-11-03 NOTE — Telephone Encounter (Signed)
Pt marked as no show 10/29/14 10:00am, pt came in late 10:20am and was rescheduled same day, charge for no show?

## 2014-11-03 NOTE — Telephone Encounter (Signed)
No charge. 

## 2015-01-06 DIAGNOSIS — M150 Primary generalized (osteo)arthritis: Secondary | ICD-10-CM | POA: Insufficient documentation

## 2015-01-06 DIAGNOSIS — M8949 Other hypertrophic osteoarthropathy, multiple sites: Secondary | ICD-10-CM | POA: Insufficient documentation

## 2015-01-06 DIAGNOSIS — D72829 Elevated white blood cell count, unspecified: Secondary | ICD-10-CM | POA: Insufficient documentation

## 2015-01-06 DIAGNOSIS — M159 Polyosteoarthritis, unspecified: Secondary | ICD-10-CM | POA: Insufficient documentation

## 2015-01-07 DIAGNOSIS — R0789 Other chest pain: Secondary | ICD-10-CM | POA: Insufficient documentation

## 2015-01-13 ENCOUNTER — Telehealth: Payer: Self-pay | Admitting: Family Medicine

## 2015-01-13 NOTE — Telephone Encounter (Signed)
Understood. You might want to let her know that we have a great new female doctor at [facility], Dr Raoul Pitch again she might like then she can stay in the system. I completely understand, wish her well for me

## 2015-01-13 NOTE — Telephone Encounter (Signed)
Pt cancelled appt 02/01/15, hospital follow up appt, she is changing providers, going to [place] at [facility], she said it is too difficult to get an appt with her provider and as much as she likes Dr. Charlett Blake she needs someone that is able to see her when needed.

## 2015-01-14 NOTE — Telephone Encounter (Signed)
Informed pt .

## 2015-02-01 ENCOUNTER — Ambulatory Visit: Payer: BLUE CROSS/BLUE SHIELD | Admitting: Family Medicine

## 2015-11-23 ENCOUNTER — Telehealth: Payer: Self-pay | Admitting: Family Medicine

## 2015-11-23 NOTE — Telephone Encounter (Signed)
Pt says that she left provider and started seeing a different PCP . Pt says that the new provider has retired. Pt would like to know if provider will accept her back as a pt?    CB: [phone]

## 2015-11-23 NOTE — Telephone Encounter (Signed)
I would she might also want to try Dr Raoul Pitch in [facility] she is very good.

## 2015-12-07 NOTE — Telephone Encounter (Signed)
Called pt back. Gave her suggestion from provider also provided Dr. Lucita Lora information.  Pt was thankful.

## 2015-12-09 ENCOUNTER — Ambulatory Visit (INDEPENDENT_AMBULATORY_CARE_PROVIDER_SITE_OTHER): Payer: BLUE CROSS/BLUE SHIELD | Admitting: Family Medicine

## 2015-12-09 ENCOUNTER — Encounter: Payer: Self-pay | Admitting: Family Medicine

## 2015-12-09 VITALS — BP 136/82 | HR 80 | Temp 98.1°F | Wt 195.8 lb

## 2015-12-09 DIAGNOSIS — R030 Elevated blood-pressure reading, without diagnosis of hypertension: Secondary | ICD-10-CM | POA: Diagnosis not present

## 2015-12-09 DIAGNOSIS — R350 Frequency of micturition: Secondary | ICD-10-CM | POA: Diagnosis not present

## 2015-12-09 DIAGNOSIS — R32 Unspecified urinary incontinence: Secondary | ICD-10-CM | POA: Diagnosis not present

## 2015-12-09 DIAGNOSIS — IMO0001 Reserved for inherently not codable concepts without codable children: Secondary | ICD-10-CM

## 2015-12-09 DIAGNOSIS — K589 Irritable bowel syndrome without diarrhea: Secondary | ICD-10-CM

## 2015-12-09 LAB — POC URINALSYSI DIPSTICK (AUTOMATED)
BILIRUBIN UA: NEGATIVE
Glucose, UA: NEGATIVE
Ketones, UA: NEGATIVE
Leukocytes, UA: NEGATIVE
NITRITE UA: NEGATIVE
PH UA: 6.5
Protein, UA: NEGATIVE
Spec Grav, UA: 1.01
Urobilinogen, UA: 0.2

## 2015-12-09 NOTE — Progress Notes (Signed)
Patient ID: Michelle Romero, female  DOB: Dec 19, 1955, 60 y.o.   MRN: [id number] Patient Care Team    Relationship Specialty Notifications Start End  Ma Hillock, DO PCP - General Family Medicine  12/09/15   Princess Bruins, MD Consulting Physician Obstetrics and Gynecology  12/09/15     Subjective:  Michelle Romero is a 60 y.o.  female present for new patient establishment, from a provider within the [facility] system. All past medical history, surgical history, allergies, family history, immunizations, medications and social history were updated in the electronic medical record today. All recent labs, ED visits and hospitalizations within the last year were reviewed.  Urinary frequency: Pt states she is experiencing urinary frequency over the last 6 months. She does drink a good deal of caffeine. She is wearing a small pad, because she is "unable to get there in time and leaks". She is getting up in the middle night about every 2 hours to urinate. She endorses urinary incontinence with sneezing and coughing sometimes.She denies dysuria, fever, chills, nausea or vomit. She denies hematuria or odor to her urine. She has increasing IBS flares, worsening last month with diarrhea prodominent.  Looser than nomral stools but not watery. In no more than 2 times a day of looser than normal stools. She had a colonoscopy in 2011 which she states was just fine.  Health maintenance:  Colonoscopy: completed 2011; reports normal.  Mammogram: completed: 2017, birads 1; at GYN at [place] Dr. Dellis Filbert, sister with ovarian cancer, yearly [place].  Cervical cancer screening: last pap: yearly with GYN secondary to family history of ovarian cancer. Immunizations: tdap 2014, Influenza decline (encouraged yearly) other immunizations unknown.  Infectious disease screening: HIV and hepatitis C screenings unknown. DEXA: Many years ago.   Immunization History  Administered Date(s) Administered  . Tdap  07/07/2007    Past Medical History:  Diagnosis Date  . Abdominal mass, right upper quadrant 01/30/2012  . Allergic state 01/30/2012  . 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 Chicken pox as a child  . DJD (degenerative joint disease) of hip 03/17/2012  . Dupuytren's contracture of hand 01/30/2012  . Elevated BP 11/30/2011  . GERD (gastroesophageal reflux disease)   . Hemorrhoids 12/28/2012  . History of blood transfusion   . Hypercalcemia   . Hyperlipidemia   . Measles as a child  . PONV (postoperative nausea and vomiting)   . Precancerous skin lesion 10/30/2011   Left abdomen, excised  . Vertigo 10/30/2011  . Vitamin D deficiency    Allergies  Allergen Reactions  . Codeine Other (See Comments)    Fainting  . Meloxicam     Vertigo, dizziness, sweating, chest tightness, diarrhea, dehydrated from being outside   Past Surgical History:  Procedure Laterality Date  . broke wrist  60 yrs old   plate placed by Dr Alphonzo Cruise at [facility]  . COLONOSCOPY  2010   [facility]  . SKIN LESION EXCISION     premelanoma on left abdominal wall  . TOTAL HIP ARTHROPLASTY  04/16/2012   Procedure: TOTAL HIP ARTHROPLASTY ANTERIOR APPROACH;  Surgeon: Gearlean Alf, MD;  Location: [facility];  Service: Orthopedics;  Laterality: Right;   Family History  Problem Relation Age of Onset  . Stroke Mother   . Hypertension Mother   . Diabetes Mother     type 2  . Dementia Mother   . Arthritis Mother   . Cancer Father     melanoma- neck  .  Arthritis Father   . Alzheimer's disease Father   . Fibromyalgia Sister   . Cancer Maternal Grandmother     pancreatic  . Heart attack Maternal Grandfather   . Cancer Paternal Grandmother     ?  Marland Kitchen Heart disease Paternal Grandfather     pacemaker  . Cancer Sister 47    ovarian  . Arthritis Brother    Social History   Social History  . Marital status: Widowed    Spouse name: N/A  . Number of children: N/A  . Years of education: 36   Occupational History  . retired     Social History Main Topics  . Smoking status: Never Smoker  . Smokeless tobacco: Never Used  . Alcohol use 0.5 oz/week    1 drink(s) per week     Comment: rarely  . Drug use: No  . Sexual activity: Not Currently   Other Topics Concern  . Not on file   Social History Narrative   Widowed. 2 children Kathee Delton and Gerrit Heck.   Masters degree. Retired.   Drinks caffeinated beverages. Uses herbal remedies.   Wears her seatbelt. Smoke detector in the home. Firearms in a locked cabinet in the home.   Exercises routinely.   Feels safe in her relationships.     Medication List       Accurate as of 12/09/15  7:24 PM. Always use your most recent med list.          aspirin 500 MG tablet Take 500 mg by mouth as needed for pain.   cholecalciferol 1000 units tablet Commonly known as:  VITAMIN D Take 1,000 Units by mouth every other day.   Glucosamine Sulfate 500 MG Caps Take by mouth.   OVER THE COUNTER MEDICATION Trace mineral supplement   OVER THE COUNTER MEDICATION Verigon.  horsechestnut        Recent Results (from the past 2160 hour(s))  POCT Urinalysis Dipstick (Automated)     Status: None   Collection Time: 12/09/15  2:17 PM  Result Value Ref Range   Color, UA yellow    Clarity, UA clear    Glucose, UA negative    Bilirubin, UA negative    Ketones, UA negative    Spec Grav, UA 1.010    Blood, UA trace-lysed    pH, UA 6.5    Protein, UA negative    Urobilinogen, UA 0.2    Nitrite, UA negative    Leukocytes, UA Negative Negative    ROS: 14 pt review of systems performed and negative (unless mentioned in an HPI)  Objective: BP 136/82   Pulse 80   Temp 98.1 F (36.7 C)   Wt 195 lb 12.8 oz (88.8 kg)   SpO2 98%   BMI 27.31 kg/m  Gen: Afebrile. No acute distress. Nontoxic in appearance, well-developed, well-nourished,  Pleasant Caucasian female. HENT: AT. [place].  MMM Eyes:Pupils Equal Round Reactive to light, Extraocular movements intact,   Conjunctiva without redness, discharge or icterus. Neck/lymp/endocrine: Supple, no lymphadenopathy CV: RRR, no edema Chest: CTAB, no wheeze, rhonchi or crackles. Abd: Soft. Round. NTND. BS resident. No Masses palpated. No hepatosplenomegaly. No rebound tenderness or guarding. No CVA tenderness. Skin:  Warm and well-perfused. Skin intact. Neuro/Msk: Normal gait. PERLA. EOMi. Alert. Oriented x3.   Psych: Normal affect, dress and demeanor. Normal speech. Normal thought content and judgment. Results for orders placed or performed in visit on 12/09/15 (from the past 24 hour(s))  POCT Urinalysis Dipstick (Automated)  Status: None   Collection Time: 12/09/15  2:17 PM  Result Value Ref Range   Color, UA yellow    Clarity, UA clear    Glucose, UA negative    Bilirubin, UA negative    Ketones, UA negative    Spec Grav, UA 1.010    Blood, UA trace-lysed    pH, UA 6.5    Protein, UA negative    Urobilinogen, UA 0.2    Nitrite, UA negative    Leukocytes, UA Negative Negative      Assessment/plan: Michelle Romero is a 60 y.o. female present for establishment care with new provider within the system. Urinary frequency/nocturia/urinary incontinence: - POCT Urinalysis Dipstick (Automated) - Urine Culture - Ambulatory referral to Urology - Briefly discussed with patient today that she should have urinary studies and post void residuals completed to permanently diagnose her urinary incontinence. I referred her to urology to have these completed. In the meantime we discussed behavior modifications with bathroom breaks every 2 hours, [place] exercises. In addition she was provided with a list of most common bladder irritants.   IBS (irritable bowel syndrome) Patient states that it is bad enough currently and she is not desiring any medications as of yet. Discussed hygiene, and will send urine for culture despite normal point of care.  Elevated BP BP improved on repeat and within normal  parameters. Will continue to monitor with visits. Patient encouraged a low sodium diet.   Patient encouraged to have yearly physicals with fasting labs.  No Follow-up on file.  Electronically signed by: Howard Pouch, DO [place]

## 2015-12-09 NOTE — Patient Instructions (Signed)
Urinary Incontinence Urinary incontinence is the involuntary loss of urine from your bladder. CAUSES  There are many causes of urinary incontinence. They include:  Medicines.  Infections.  Prostatic enlargement, leading to overflow of urine from your bladder.  Surgery.  Neurological diseases.  Emotional factors. SIGNS AND SYMPTOMS Urinary Incontinence can be divided into four types: 1. Urge incontinence. Urge incontinence is the involuntary loss of urine before you have the opportunity to go to the bathroom. There is a sudden urge to void but not enough time to reach a bathroom. 2. Stress incontinence. Stress incontinence is the sudden loss of urine with any activity that forces urine to pass. It is commonly caused by anatomical changes to the pelvis and sphincter areas of your body. 3. Overflow incontinence. Overflow incontinence is the loss of urine from an obstructed opening to your bladder. This results in a backup of urine and a resultant buildup of pressure within the bladder. When the pressure within the bladder exceeds the closing pressure of the sphincter, the urine overflows, which causes incontinence, similar to water overflowing a dam. 4. Total incontinence. Total incontinence is the loss of urine as a result of the inability to store urine within your bladder. DIAGNOSIS  Evaluating the cause of incontinence may require:  A thorough and complete medical and obstetric history.  A complete physical exam.  Laboratory tests such as a urine culture and sensitivities. When additional tests are indicated, they can include:  An ultrasound exam.  Kidney and bladder X-rays.  Cystoscopy. This is an exam of the bladder using a narrow scope.  Urodynamic testing to test the nerve function to the bladder and sphincter areas. TREATMENT  Treatment for urinary incontinence depends on the cause:  For urge incontinence caused by a bacterial infection, antibiotics will be prescribed.  If the urge incontinence is related to medicines you take, your health care provider may have you change the medicine.  For stress incontinence, surgery to re-establish anatomical support to the bladder or sphincter, or both, will often correct the condition.  For overflow incontinence caused by an enlarged prostate, an operation to open the channel through the enlarged prostate will allow the flow of urine out of the bladder. In women with fibroids, a hysterectomy may be recommended.  For total incontinence, surgery on your urinary sphincter may help. An artificial urinary sphincter (an inflatable cuff placed around the urethra) may be required. In women who have developed a hole-like passage between their bladder and vagina (vesicovaginal fistula), surgery to close the fistula often is required. HOME CARE INSTRUCTIONS  Normal daily hygiene and the use of pads or adult diapers that are changed regularly will help prevent odors and skin damage.  Avoid caffeine. It can overstimulate your bladder.  Use the bathroom regularly. Try about every 2-3 hours to go to the bathroom, even if you do not feel the need to do so. Take time to empty your bladder completely. After urinating, wait a minute. Then try to urinate again.  For causes involving nerve dysfunction, keep a log of the medicines you take and a journal of the times you go to the bathroom. SEEK MEDICAL CARE IF:  You experience worsening of pain instead of improvement in pain after your procedure.  Your incontinence becomes worse instead of better. SEE IMMEDIATE MEDICAL CARE IF:  You experience fever or shaking chills.  You are unable to pass your urine.  You have redness spreading into your groin or down into your thighs. MAKE SURE   YOU:   Understand these instructions.   Will watch your condition.  Will get help right away if you are not doing well or get worse.   This information is not intended to replace advice given to you  by your health care provider. Make sure you discuss any questions you have with your health care provider.   Document Released: 04/26/2004 Document Revised: 04/09/2014 Document Reviewed: 08/26/2012 Elsevier Interactive Patient Education 2016 Elsevier Inc.  

## 2015-12-11 ENCOUNTER — Telehealth: Payer: Self-pay | Admitting: Family Medicine

## 2015-12-11 LAB — URINE CULTURE: ORGANISM ID, BACTERIA: NO GROWTH

## 2015-12-11 NOTE — Telephone Encounter (Signed)
Please call pt: - urine culture negative

## 2015-12-12 NOTE — Telephone Encounter (Signed)
Left message with lab results on patient voice mail per DPR. 

## 2016-07-09 ENCOUNTER — Encounter: Payer: Self-pay | Admitting: Family Medicine

## 2016-07-09 ENCOUNTER — Ambulatory Visit (INDEPENDENT_AMBULATORY_CARE_PROVIDER_SITE_OTHER): Payer: BLUE CROSS/BLUE SHIELD | Admitting: Family Medicine

## 2016-07-09 VITALS — BP 137/84 | HR 91 | Temp 98.4°F | Resp 20 | Wt 202.8 lb

## 2016-07-09 DIAGNOSIS — S80862A Insect bite (nonvenomous), left lower leg, initial encounter: Secondary | ICD-10-CM

## 2016-07-09 DIAGNOSIS — W57XXXA Bitten or stung by nonvenomous insect and other nonvenomous arthropods, initial encounter: Secondary | ICD-10-CM

## 2016-07-09 MED ORDER — DOXYCYCLINE HYCLATE 100 MG PO TABS: 100.0000 mg | ORAL_TABLET | Freq: Two times a day (BID) | ORAL | 0 refills | Status: DC

## 2016-07-09 MED ORDER — TRIAMCINOLONE ACETONIDE 0.1 % EX CREA: 1.0000 "application " | TOPICAL_CREAM | Freq: Two times a day (BID) | CUTANEOUS | 0 refills | Status: DC

## 2016-07-09 NOTE — Patient Instructions (Signed)
I have called in doxycyline every 12 hours for 7 days.   Steroid cream for itch/redness.     Lyme Disease Lyme disease is an infection that affects many parts of the body, including the skin, joints, and nervous system. It is a bacterial infection that starts from the bite of an infected tick. The infection can spread, and some of the symptoms are similar to the flu. If Lyme disease is not treated, it may cause joint pain, swelling, numbness, problems thinking, fatigue, muscle weakness, and other problems. What are the causes? This condition is caused by bacteria called Borrelia burgdorferi. You can get Lyme disease by being bitten by an infected tick. The tick must be attached to your skin to pass along the infection. Deer often carry infected ticks. What increases the risk? The following factors may make you more likely to develop this condition:  Living in or visiting these areas in the [place]  [place].  The [place].  The [place].  Spending time in wooded or grassy areas.  Being outdoors with exposed skin.  Camping, gardening, hiking, fishing, or hunting outdoors.  Failing to remove a tick from your skin within 3-4 days. What are the signs or symptoms? Symptoms of this condition include:  A round, red rash that surrounds the center of the tick bite. This is the first sign of infection. The center of the rash may be blood colored or have tiny blisters.  Fatigue.  Headache.  Chills and fever.  General achiness.  Joint pain, often in the knees.  Muscle pain.  Swollen lymph glands.  Stiff neck. How is this diagnosed? This condition is diagnosed based on:  Your symptoms and medical history.  A physical exam.  A blood test. How is this treated? The main treatment for this condition is antibiotic medicine, which is usually taken by mouth (orally). The length of treatment depends on how soon after a tick bite you begin taking the medicine. In  some cases, treatment is necessary for several weeks. If the infection is severe, antibiotics may need to be given through an IV tube that is inserted into one of your veins. Follow these instructions at home:  Take your antibiotic medicine as told by your health care provider. Do not stop taking the antibiotic even if you start to feel better.  Ask your health care provider about takinga probiotic in between doses of your antibiotic to help avoid stomach upset or diarrhea.  Check with your health care provider before supplementing your treatment. Many alternative therapies have not been proven and may be harmful to you.  Keep all follow-up visits as told by your health care provider. This is important. How is this prevented? You can become reinfected if you get another tick bite from an infected tick. Take these steps to help prevent an infection:  Cover your skin with light-colored clothing when you are outdoors in the spring and summer months.  Spray clothing and skin with bug spray. The spray should be 20-30% DEET.  Avoid wooded, grassy, and shaded areas.  Remove yard litter, brush, trash, and plants that attract deer and rodents.  Check yourself for ticks when you come indoors.  Wash clothing worn each day.  Check your pets for ticks before they come inside.  If you find a tick:  Remove it with tweezers.  Clean your hands and the bite area with rubbing alcohol or soap and water. Pregnant women should take special care to avoid tick bites  because the infection can be passed along to the fetus. Contact a health care provider if:  You have symptoms after treatment.  You have removed a tick and want to bring it to your health care provider for testing. Get help right away if:  You have an irregular heartbeat.  You have nerve pain.  Your face feels numb. This information is not intended to replace advice given to you by your health care provider. Make sure you discuss  any questions you have with your health care provider. Document Released: 06/25/2000 Document Revised: 11/08/2015 Document Reviewed: 11/08/2015 Elsevier Interactive Patient Education  2017 [employer].

## 2016-07-09 NOTE — Progress Notes (Signed)
Michelle Romero , July 14, 1955, 61 y.o., female MRN: [id number] Patient Care Team    Relationship Specialty Notifications Start End  Ma Hillock, DO PCP - General Family Medicine  12/09/15   Princess Bruins, MD Consulting Physician Obstetrics and Gynecology  12/09/15     CC: tick bite Subjective: Pt presents for an OV with complaints of tick bite 6 days ago.  She is uncertain the length of time the tick was attached. She is in the woods daily walking her dog. She removed the tick easily with tweezers and believes she was able to keep it intact. SHe states she has had numerous tick bites in the past and they have never become swollen or red like this one has started to be. She denies fever, chills, nausea, other rash or headache.  No flowsheet data found.  Allergies  Allergen Reactions  . Codeine Other (See Comments)    Fainting  . Meloxicam     Vertigo, dizziness, sweating, chest tightness, diarrhea, dehydrated from being outside   Social History  Substance Use Topics  . Smoking status: Never Smoker  . Smokeless tobacco: Never Used  . Alcohol use 0.5 oz/week    1 drink(s) per week     Comment: rarely   Past Medical History:  Diagnosis Date  . Abdominal mass, right upper quadrant 01/30/2012  . Allergic state 01/30/2012  . 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 Chicken pox as a child  . DJD (degenerative joint disease) of hip 03/17/2012  . Dupuytren's contracture of hand 01/30/2012  . Elevated BP 11/30/2011  . GERD (gastroesophageal reflux disease)   . Hemorrhoids 12/28/2012  . History of blood transfusion   . Hypercalcemia   . Hyperlipidemia   . Measles as a child  . PONV (postoperative nausea and vomiting)   . Precancerous skin lesion 10/30/2011   Left abdomen, excised  . Vertigo 10/30/2011  . Vitamin D deficiency    Past Surgical History:  Procedure Laterality Date  . broke wrist  61 yrs old   plate placed by Dr Alphonzo Cruise at [facility]  . COLONOSCOPY  2010   [facility]  . SKIN LESION  EXCISION     premelanoma on left abdominal wall  . TOTAL HIP ARTHROPLASTY  04/16/2012   Procedure: TOTAL HIP ARTHROPLASTY ANTERIOR APPROACH;  Surgeon: Gearlean Alf, MD;  Location: [facility];  Service: Orthopedics;  Laterality: Right;   Family History  Problem Relation Age of Onset  . Stroke Mother   . Hypertension Mother   . Diabetes Mother     type 2  . Dementia Mother   . Arthritis Mother   . Cancer Father     melanoma- neck  . Arthritis Father   . Alzheimer's disease Father   . Fibromyalgia Sister   . Cancer Maternal Grandmother     pancreatic  . Heart attack Maternal Grandfather   . Cancer Paternal Grandmother     ?  Marland Kitchen Heart disease Paternal Grandfather     pacemaker  . Cancer Sister 21    ovarian  . Arthritis Brother    Allergies as of 07/09/2016      Reactions   Codeine Other (See Comments)   Fainting   Meloxicam    Vertigo, dizziness, sweating, chest tightness, diarrhea, dehydrated from being outside      Medication List       Accurate as of 07/09/16  2:17 PM. Always use your most recent med list.  aspirin 500 MG tablet Take 500 mg by mouth as needed for pain.   cholecalciferol 1000 units tablet Commonly known as:  VITAMIN D Take 1,000 Units by mouth every other day.   Glucosamine Sulfate 500 MG Caps Take by mouth.   OVER THE COUNTER MEDICATION Trace mineral supplement   OVER THE COUNTER MEDICATION Verigon.  horsechestnut       No results found for this or any previous visit (from the past 24 hour(s)). No results found.   ROS: Negative, with the exception of above mentioned in HPI   Objective:  BP 137/84 (BP Location: Right Arm, Patient Position: Sitting, Cuff Size: Large)   Pulse 91   Temp 98.4 F (36.9 C)   Resp 20   Wt 202 lb 12 oz (92 kg)   SpO2 98%   BMI 28.28 kg/m  Body mass index is 28.28 kg/m. Gen: Afebrile. No acute distress. Nontoxic in appearance, well developed, well nourished.  HENT: AT. [place]. MMM Skin: tick  bite left posterior thigh, mild red ring surround with red raised center. No drainage. NV intact distally.  Loop magnification used to inspect wound and no insect remains identified.  Neuro: Normal gait. PERLA. EOMi. Alert. Oriented x3   Assessment/Plan: Michelle Romero is a 61 y.o. female present for OV for  Tick bite of left lower leg, initial encounter Area surrounding bite does have a red ring surrounding. Doxycyline 100 mg BID 7 days triamcinolone cream  F/U PRN  Reviewed expectations re: course of current medical issues.  Discussed self-management of symptoms.  Outlined signs and symptoms indicating need for more acute intervention.  Patient verbalized understanding and all questions were answered.  Patient received an After-Visit Summary.   electronically signed by:  Howard Pouch, DO  [place]   s

## 2016-07-16 ENCOUNTER — Telehealth: Payer: Self-pay | Admitting: Family Medicine

## 2016-07-16 ENCOUNTER — Encounter: Payer: Self-pay | Admitting: Family Medicine

## 2016-07-16 ENCOUNTER — Ambulatory Visit (INDEPENDENT_AMBULATORY_CARE_PROVIDER_SITE_OTHER): Payer: BLUE CROSS/BLUE SHIELD | Admitting: Family Medicine

## 2016-07-16 VITALS — BP 134/80 | HR 77 | Temp 97.6°F | Resp 20 | Wt 205.2 lb

## 2016-07-16 DIAGNOSIS — W57XXXD Bitten or stung by nonvenomous insect and other nonvenomous arthropods, subsequent encounter: Secondary | ICD-10-CM | POA: Diagnosis not present

## 2016-07-16 DIAGNOSIS — S30861D Insect bite (nonvenomous) of abdominal wall, subsequent encounter: Secondary | ICD-10-CM | POA: Diagnosis not present

## 2016-07-16 MED ORDER — DOXYCYCLINE HYCLATE 100 MG PO TABS: 100.0000 mg | ORAL_TABLET | Freq: Two times a day (BID) | ORAL | 0 refills | Status: DC

## 2016-07-16 NOTE — Patient Instructions (Signed)
Additional 7 days covered, mostly because of the joint pain potential. Although I suspect the 7 days would have been fine. The actual site looks good. The discoloration is just the hyperpigmented area from the inflammation and this can take a few months to resolve, this is not the "bulls eye" rash we get concerned about.  Make sure to take the probiotic.

## 2016-07-16 NOTE — Progress Notes (Signed)
Michelle Romero , [birth year], 61 y.o., female MRN: [id number] Patient Care Team    Relationship Specialty Notifications Start End  Ma Hillock, DO PCP - General Family Medicine  12/09/15   Princess Bruins, MD Consulting Physician Obstetrics and Gynecology  12/09/15     CC: tick bite Subjective: Pt reports concerns over only 7 day treatment of doxycyline for tick bite. She reports ring around bite is still present, but the selling resolved. She has had bilateral knee pain, without redness or swelling. She has chronic knee pain from prior conditions. She denies headache, fever, chills, rash or nausea.   Prior note: Pt presents for an OV with complaints of tick bite 6 days ago.  She is uncertain the length of time the tick was attached. She is in the woods daily walking her dog. She removed the tick easily with tweezers and believes she was able to keep it intact. SHe states she has had numerous tick bites in the past and they have never become swollen or red like this one has started to be. She denies fever, chills, nausea, other rash or headache.  No flowsheet data found.  Allergies  Allergen Reactions  . Codeine Other (See Comments)    Fainting  . Meloxicam     Vertigo, dizziness, sweating, chest tightness, diarrhea, dehydrated from being outside   Social History  Substance Use Topics  . Smoking status: Never Smoker  . Smokeless tobacco: Never Used  . Alcohol use 0.5 oz/week    1 drink(s) per week     Comment: rarely   Past Medical History:  Diagnosis Date  . Abdominal mass, right upper quadrant 01/30/2012  . Allergic state 01/30/2012  . 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 Chicken pox as a child  . DJD (degenerative joint disease) of hip 03/17/2012  . Dupuytren's contracture of hand 01/30/2012  . Elevated BP 11/30/2011  . GERD (gastroesophageal reflux disease)   . Hemorrhoids 12/28/2012  . History of blood transfusion   . Hypercalcemia   . Hyperlipidemia   . Measles as a child    . PONV (postoperative nausea and vomiting)   . Precancerous skin lesion 10/30/2011   Left abdomen, excised  . Vertigo 10/30/2011  . Vitamin D deficiency    Past Surgical History:  Procedure Laterality Date  . broke wrist  61 yrs old   plate placed by Dr Alphonzo Cruise at [facility]  . COLONOSCOPY  2010   [facility]  . SKIN LESION EXCISION     premelanoma on left abdominal wall  . TOTAL HIP ARTHROPLASTY  04/16/2012   Procedure: TOTAL HIP ARTHROPLASTY ANTERIOR APPROACH;  Surgeon: Gearlean Alf, MD;  Location: [facility];  Service: Orthopedics;  Laterality: Right;   Family History  Problem Relation Age of Onset  . Stroke Mother   . Hypertension Mother   . Diabetes Mother     type 2  . Dementia Mother   . Arthritis Mother   . Cancer Father     melanoma- neck  . Arthritis Father   . Alzheimer's disease Father   . Fibromyalgia Sister   . Cancer Maternal Grandmother     pancreatic  . Heart attack Maternal Grandfather   . Cancer Paternal Grandmother     ?  Marland Kitchen Heart disease Paternal Grandfather     pacemaker  . Cancer Sister 1    ovarian  . Arthritis Brother    Allergies as of 07/16/2016      Reactions  Codeine Other (See Comments)   Fainting   Meloxicam    Vertigo, dizziness, sweating, chest tightness, diarrhea, dehydrated from being outside      Medication List       Accurate as of 07/16/16  2:34 PM. Always use your most recent med list.          aspirin 500 MG tablet Take 500 mg by mouth as needed for pain.   cholecalciferol 1000 units tablet Commonly known as:  VITAMIN D Take 1,000 Units by mouth every other day.   Glucosamine Sulfate 500 MG Caps Take by mouth.   OVER THE COUNTER MEDICATION Trace mineral supplement   OVER THE COUNTER MEDICATION Verigon.  horsechestnut   triamcinolone cream 0.1 % Commonly known as:  KENALOG Apply 1 application topically 2 (two) times daily.       No results found for this or any previous visit (from the past 24 hour(s)). No  results found.   ROS: Negative, with the exception of above mentioned in HPI   Objective:  BP 134/80 (BP Location: Right Arm, Patient Position: Sitting, Cuff Size: Large)   Pulse 77   Temp 97.6 F (36.4 C)   Resp 20   Wt 205 lb 4 oz (93.1 kg)   SpO2 97%   BMI 28.63 kg/m  Body mass index is 28.63 kg/m.  Gen: Afebrile. No acute distress.  HENT: AT. [place]. MMM.  Eyes:Pupils Equal Round Reactive to light, Extraocular movements intact,  Conjunctiva without redness, discharge or icterus. Skin: no rashes, purpura or petechiae. Tick bite left posterior thigh much improved, hyperpigmentation ring only, swelling resolved. No erythema.  MSK: Bilateral knees without erythema, swelling or tenderness.  Neuro:  Normal gait. PERLA. EOMi. Alert. Oriented.  Psych: Normal affect, dress and demeanor. Normal speech. Normal thought content and judgment.   Assessment/Plan: Michelle Romero is a 61 y.o. female present for OV for  Tick bite of left lower leg, initial encounter Area surrounding bite is improved with hyperpigmentation only.  New joint pain reported in bilateral knees, however she has chronic intermittent knee pain and no erythema or swelling on exam.  Doxycyline 100 mg BID additional 7 days; no additional meds should be needed. Pt states it would make her feel better to have additional coverage, she is aware of risk of continued abx. Continue probiotic.  Can continue, if desired, triamcinolone cream  F/U PRN  Reviewed expectations re: course of current medical issues.  Discussed self-management of symptoms.  Outlined signs and symptoms indicating need for more acute intervention.  Patient verbalized understanding and all questions were answered.  Patient received an After-Visit Summary.   electronically signed by:  Howard Pouch, DO  [place]   s

## 2016-07-16 NOTE — Telephone Encounter (Signed)
Patient has scheduled appt 

## 2016-07-16 NOTE — Telephone Encounter (Signed)
Patient Name: Michelle Romero  DOB: 1955-11-25    Initial Comment Caller states c/o tick bite last, is on antibiotics, she is concerned that she needs to be on antibiotics longer than 7 days.   Nurse Assessment  Nurse: Raphael Gibney, RN, Vanita Ingles Date/Time (Eastern Time): 07/16/2016 9:29:10 AM  Confirm and document reason for call. If symptomatic, describe symptoms. ---Caller states she saw doctor a week ago. Had bulls ring around bite area and started on antibiotics. She has been taking doxycycline for 7 days. takes her last dose tomorrow. Bull ring has faded.  Does the patient have any new or worsening symptoms? ---Yes  Will a triage be completed? ---Yes  Related visit to physician within the last 2 weeks? ---Yes  Does the PT have any chronic conditions? (i.e. diabetes, asthma, etc.) ---Yes  List chronic conditions. ---joint damage  Is this a behavioral health or substance abuse call? ---No     Guidelines    Guideline Title Affirmed Question Affirmed Notes  Cellulitis on Antibiotic Follow-up Call [1] Caller has NON-URGENT question AND [2] triager unable to answer question    Final Disposition User   Call PCP within 24 Hours Raphael Gibney, RN, Vanita Ingles    Comments  appt scheduled for 07/16/2016 at 2: 30 pm with Dr. Howard Pouch  Saw doctor a week ago for a tick bite. Bulls eye ring around bite area. Bulls eye ring has faded. Concerned she may need for than 1 week of antibiotics.   Referrals  REFERRED TO PCP OFFICE   Disagree/Comply: Comply

## 2017-02-06 ENCOUNTER — Telehealth: Payer: Self-pay | Admitting: *Deleted

## 2017-02-06 DIAGNOSIS — Z8041 Family history of malignant neoplasm of ovary: Secondary | ICD-10-CM

## 2017-02-06 NOTE — Telephone Encounter (Signed)
Patient called stating Dr.Lavoie orders ultrasounds with annual due to family history of ovarian cancer. Per [facility] chart ultrasound order. Front desk will scheduled.

## 2017-03-13 ENCOUNTER — Other Ambulatory Visit: Payer: Self-pay

## 2017-03-13 ENCOUNTER — Encounter: Payer: Self-pay | Admitting: Obstetrics & Gynecology

## 2017-03-15 ENCOUNTER — Ambulatory Visit: Payer: BLUE CROSS/BLUE SHIELD | Admitting: Family Medicine

## 2017-03-15 ENCOUNTER — Encounter: Payer: Self-pay | Admitting: Family Medicine

## 2017-03-15 VITALS — BP 150/74 | HR 64 | Temp 98.3°F | Wt 205.0 lb

## 2017-03-15 DIAGNOSIS — J309 Allergic rhinitis, unspecified: Secondary | ICD-10-CM

## 2017-03-15 DIAGNOSIS — Z23 Encounter for immunization: Secondary | ICD-10-CM

## 2017-03-15 NOTE — Patient Instructions (Signed)
Pick up Zyrtec (generic store brand) and take nightly. (may need continuously)  Mucinex plain before bed, this decreases the mucous production. (for at least a week)  Continue nasal saline. Air filters changed every 3-4 months. Use allergy filter.    If not improved in 4 weeks, then be seen and we can consider increasing medication management.

## 2017-03-15 NOTE — Progress Notes (Signed)
Michelle Romero , [birth year], 61 y.o., female MRN: [id number] Patient Care Team    Relationship Specialty Notifications Start End  Ma Hillock, DO PCP - General Family Medicine  12/09/15   Princess Bruins, MD Consulting Physician Obstetrics and Gynecology  12/09/15     Chief Complaint  Patient presents with  . URI    pts complain of sinus drainage     Subjective: Pt presents for an OV with complaints of sinus drainage that is worsening over the last few weeks. She has had difficulty with allergies for the last 4 years.  She feels her allergies are worse at night when laying flat. The phlegm gags her at times. She has also awakened at night secondary to feeling like she is choking on phlegm.  She feels she is snoring more. She has tried benadryl, which is helpful, but makes her groggy. She denies fever, chills, sinus headache, pressure nausea, vomit or diarrhea.   Depression screen PHQ 2/9 03/15/2017  Decreased Interest 0  Down, Depressed, Hopeless 0  PHQ - 2 Score 0    Allergies  Allergen Reactions  . Codeine Other (See Comments)    Fainting  . Meloxicam     Vertigo, dizziness, sweating, chest tightness, diarrhea, dehydrated from being outside   Social History   Tobacco Use  . Smoking status: Never Smoker  . Smokeless tobacco: Never Used  Substance Use Topics  . Alcohol use: Yes    Alcohol/week: 0.5 oz    Types: 1 drink(s) per week    Comment: rarely   Past Medical History:  Diagnosis Date  . Abdominal mass, right upper quadrant 01/30/2012  . Allergic state 01/30/2012  . 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 Chicken pox as a child  . DJD (degenerative joint disease) of hip 03/17/2012  . Dupuytren's contracture of hand 01/30/2012  . Elevated BP 11/30/2011  . GERD (gastroesophageal reflux disease)   . Hemorrhoids 12/28/2012  . History of blood transfusion   . Hypercalcemia   . Hyperlipidemia   . Measles as a child  . PONV (postoperative nausea and vomiting)   .  Precancerous skin lesion 10/30/2011   Left abdomen, excised  . Vertigo 10/30/2011  . Vitamin D deficiency    Past Surgical History:  Procedure Laterality Date  . broke wrist  61 yrs old   plate [facility] by Dr Alphonzo [facility] at [facility]  . COLONOSCOPY  2010   [facility]  . SKIN LESION EXCISION     premelanoma on left abdominal wall  . TOTAL HIP ARTHROPLASTY  04/16/2012   Procedure: TOTAL HIP ARTHROPLASTY ANTERIOR APPROACH;  Surgeon: Gearlean Alf, MD;  Location: [facility];  Service: Orthopedics;  Laterality: Right;   Family History  Problem Relation Age of Onset  . Stroke Mother   . Hypertension Mother   . Diabetes Mother        type 2  . Dementia Mother   . Arthritis Mother   . Cancer Father        melanoma- neck  . Arthritis Father   . Alzheimer's disease Father   . Fibromyalgia Sister   . Cancer Maternal Grandmother        pancreatic  . Heart attack Maternal Grandfather   . Cancer Paternal Grandmother        ?  Marland Kitchen Heart disease Paternal Grandfather        pacemaker  . Cancer Sister 58       ovarian  . Arthritis  Brother    Allergies as of 03/15/2017      Reactions   Codeine Other (See Comments)   Fainting   Meloxicam    Vertigo, dizziness, sweating, chest tightness, diarrhea, dehydrated from being outside      Medication List        Accurate as of 03/15/17 10:42 AM. Always use your most recent med list.          aspirin 500 MG tablet Take 500 mg by mouth as needed for pain.   cholecalciferol 1000 units tablet Commonly known as:  VITAMIN D Take 1,000 Units by mouth every other day.   doxycycline 100 MG tablet Commonly known as:  VIBRA-TABS Take 1 tablet (100 mg total) by mouth 2 (two) times daily.   Glucosamine Sulfate 500 MG Caps Take by mouth.   OVER THE COUNTER MEDICATION Trace mineral supplement   OVER THE COUNTER MEDICATION Verigon.  horsechestnut   triamcinolone cream 0.1 % Commonly known as:  KENALOG Apply 1 application topically 2 (two) times  daily.       All past medical history, surgical history, allergies, family history, immunizations andmedications were updated in the EMR today and reviewed under the history and medication portions of their EMR.     ROS: Negative, with the exception of above mentioned in HPI   Objective:  BP (!) 150/74 (BP Location: Right Arm, Patient Position: Sitting, Cuff Size: Normal)   Pulse 64   Temp 98.3 F (36.8 C)   Wt 205 lb (93 kg)   SpO2 96%   BMI 28.59 kg/m  Body mass index is 28.59 kg/m. Gen: Afebrile. No acute distress. Nontoxic in appearance, well developed, well nourished. Overweight caucasian female.  HENT: AT. [place]. Bilateral TM visualized with erythema or bulging, mild right fullness. MMM, no oral lesions. Bilateral nares without erythema or swelling, mild drainage. Throat without erythema or exudates. PND present. No cough or hoarseness.  Eyes:Pupils Equal Round Reactive to light, Extraocular movements intact,  Conjunctiva without redness, discharge or icterus. Neck/lymp/endocrine: Supple,no lymphadenopathy CV: RRR  Chest: CTAB, no wheeze or crackles. Good air movement, normal resp effort.   No exam data present No results found. No results found for this or any previous visit (from the past 24 hour(s)).  Assessment/Plan: Michelle Romero is a 61 y.o. female present for OV for  Influenza vaccination administered at current visit - Flu Vaccine QUAD 6+ mos PF IM (Fluarix Quad PF) Allergic rhinitis, unspecified seasonality, unspecified trigger - No cough or infectious signs.  - start zyrtec nightly (OTC).  - start mucinex plain for at least a week.  - allergen filters change every 3-4 months.  - F/u 4 weeks if not improved, would consider adding Atrovent nasal spray vs Singulair.    Reviewed expectations re: course of current medical issues.  Discussed self-management of symptoms.  Outlined signs and symptoms indicating need for more acute intervention.  Patient  verbalized understanding and all questions were answered.  Patient received an After-Visit Summary.    Orders Placed This Encounter  Procedures  . Flu Vaccine QUAD 6+ mos PF IM (Fluarix Quad PF)   Note is dictated utilizing voice recognition software. Although note has been proof read prior to signing, occasional typographical errors still can be missed. If any questions arise, please do not hesitate to call for verification.   electronically signed by:  Howard Pouch, DO  [place]

## 2017-04-08 ENCOUNTER — Other Ambulatory Visit: Payer: Self-pay | Admitting: Obstetrics & Gynecology

## 2017-04-08 ENCOUNTER — Ambulatory Visit (INDEPENDENT_AMBULATORY_CARE_PROVIDER_SITE_OTHER): Payer: Self-pay | Admitting: Obstetrics & Gynecology

## 2017-04-08 ENCOUNTER — Encounter: Payer: Self-pay | Admitting: Obstetrics & Gynecology

## 2017-04-08 ENCOUNTER — Ambulatory Visit (INDEPENDENT_AMBULATORY_CARE_PROVIDER_SITE_OTHER): Payer: Self-pay

## 2017-04-08 VITALS — BP 138/82 | Ht 70.0 in | Wt 203.0 lb

## 2017-04-08 DIAGNOSIS — Z780 Asymptomatic menopausal state: Secondary | ICD-10-CM

## 2017-04-08 DIAGNOSIS — D252 Subserosal leiomyoma of uterus: Secondary | ICD-10-CM

## 2017-04-08 DIAGNOSIS — Z8041 Family history of malignant neoplasm of ovary: Secondary | ICD-10-CM

## 2017-04-08 DIAGNOSIS — D219 Benign neoplasm of connective and other soft tissue, unspecified: Secondary | ICD-10-CM

## 2017-04-08 DIAGNOSIS — Z01419 Encounter for gynecological examination (general) (routine) without abnormal findings: Secondary | ICD-10-CM

## 2017-04-08 DIAGNOSIS — Z13820 Encounter for screening for osteoporosis: Secondary | ICD-10-CM

## 2017-04-08 NOTE — Patient Instructions (Signed)
1. Well female exam with routine gynecological exam Normal gynecologic exam with a small fibroid uterus.  Pap test negative with high risk HPV negative November 2017.  Will repeat Pap test next year.  Breast exam normal.  Patient had a negative mammogram at [facility] November 2017 that was negative.  She will schedule her next mammogram now.  Colonoscopy in 2011.  Bone density to organize here.  2. Menopause present Menopause well-tolerated on no hormone replacement therapy.  No postmenopausal bleeding.  Vitamin D supplements and calcium rich nutrition recommended.  Patient is physically active with horseback riding and walking.  3. Fibroid No significant change in uterine fibroid measuring 4.4 cm currently.  As symptomatic.  4. Family history of ovarian cancer Family history of ovarian cancer in sister at 63 year old.  Pelvic ultrasound today is reassuring but a small solid focus measuring 9 mm is seen on the right ovary.  No increased blood flow, but suboptimal views due to bowel activity.  Will do Ca1 25 today and repeat pelvic ultrasound in 3 months.  Findings discussed with patient who agrees with plan. - CA 125 - US Transvaginal Non-OB; Future  Cameron, it was a pleasure seeing y I will inform you of your results as soon as they are available.ou today!     Health Maintenance for Postmenopausal Women Menopause is a normal process in which your reproductive ability comes to an end. This process happens gradually over a span of months to years, usually between the ages of 47 and 63. Menopause is complete when you have missed 12 consecutive menstrual periods. It is important to talk with your health care provider about some of the most common conditions that affect postmenopausal women, such as heart disease, cancer, and bone loss (osteoporosis). Adopting a healthy lifestyle and getting preventive care can help to promote your health and wellness. Those actions can also lower your chances of  developing some of these common conditions. What should I know about menopause? During menopause, you may experience a number of symptoms, such as:  Moderate-to-severe hot flashes.  Night sweats.  Decrease in sex drive.  Mood swings.  Headaches.  Tiredness.  Irritability.  Memory problems.  Insomnia.  Choosing to treat or not to treat menopausal changes is an individual decision that you make with your health care provider. What should I know about hormone replacement therapy and supplements? Hormone therapy products are effective for treating symptoms that are associated with menopause, such as hot flashes and night sweats. Hormone replacement carries certain risks, especially as you become older. If you are thinking about using estrogen or estrogen with progestin treatments, discuss the benefits and risks with your health care provider. What should I know about heart disease and stroke? Heart disease, heart attack, and stroke become more likely as you age. This may be due, in part, to the hormonal changes that your body experiences during menopause. These can affect how your body processes dietary fats, triglycerides, and cholesterol. Heart attack and stroke are both medical emergencies. There are many things that you can do to help prevent heart disease and stroke:  Have your blood pressure checked at least every 1-2 years. High blood pressure causes heart disease and increases the risk of stroke.  If you are 63-66 years old, ask your health care provider if you should take aspirin to prevent a heart attack or a stroke.  Do not use any tobacco products, including cigarettes, chewing tobacco, or electronic cigarettes. If you need help quitting, ask  your health care provider.  It is important to eat a healthy diet and maintain a healthy weight. ? Be sure to include plenty of vegetables, fruits, low-fat dairy products, and lean protein. ? Avoid eating foods that are high in solid  fats, added sugars, or salt (sodium).  Get regular exercise. This is one of the most important things that you can do for your health. ? Try to exercise for at least 150 minutes each week. The type of exercise that you do should increase your heart rate and make you sweat. This is known as moderate-intensity exercise. ? Try to do strengthening exercises at least twice each week. Do these in addition to the moderate-intensity exercise.  Know your numbers.Ask your health care provider to check your cholesterol and your blood glucose. Continue to have your blood tested as directed by your health care provider.  What should I know about cancer screening? There are several types of cancer. Take the following steps to reduce your risk and to catch any cancer development as early as possible. Breast Cancer  Practice breast self-awareness. ? This means understanding how your breasts normally appear and feel. ? It also means doing regular breast self-exams. Let your health care provider know about any changes, no matter how small.  If you are 63 or older, have a clinician do a breast exam (clinical breast exam or CBE) every year. Depending on your age, family history, and medical history, it may be recommended that you also have a yearly breast X-ray (mammogram).  If you have a family history of breast cancer, talk with your health care provider about genetic screening.  If you are at high risk for breast cancer, talk with your health care provider about having an MRI and a mammogram every year.  Breast cancer (BRCA) gene test is recommended for women who have family members with BRCA-related cancers. Results of the assessment will determine the need for genetic counseling and BRCA1 and for BRCA2 testing. BRCA-related cancers include these types: ? Breast. This occurs in males or females. ? Ovarian. ? Tubal. This may also be called fallopian tube cancer. ? Cancer of the abdominal or pelvic lining  (peritoneal cancer). ? Prostate. ? Pancreatic.  Cervical, Uterine, and Ovarian Cancer Your health care provider may recommend that you be screened regularly for cancer of the pelvic organs. These include your ovaries, uterus, and vagina. This screening involves a pelvic exam, which includes checking for microscopic changes to the surface of your cervix (Pap test).  For women ages 21-65, health care providers may recommend a pelvic exam and a Pap test every three years. For women ages 62-65, they may recommend the Pap test and pelvic exam, combined with testing for human papilloma virus (HPV), every five years. Some types of HPV increase your risk of cervical cancer. Testing for HPV may also be done on women of any age who have unclear Pap test results.  Other health care providers may not recommend any screening for nonpregnant women who are considered low risk for pelvic cancer and have no symptoms. Ask your health care provider if a screening pelvic exam is right for you.  If you have had past treatment for cervical cancer or a condition that could lead to cancer, you need Pap tests and screening for cancer for at least 20 years after your treatment. If Pap tests have been discontinued for you, your risk factors (such as having a new sexual partner) need to be reassessed to determine if  you should start having screenings again. Some women have medical problems that increase the chance of getting cervical cancer. In these cases, your health care provider may recommend that you have screening and Pap tests more often.  If you have a family history of uterine cancer or ovarian cancer, talk with your health care provider about genetic screening.  If you have vaginal bleeding after reaching menopause, tell your health care provider.  There are currently no reliable tests available to screen for ovarian cancer.  Lung Cancer Lung cancer screening is recommended for adults 55-12 years old who are at  high risk for lung cancer because of a history of smoking. A yearly low-dose CT scan of the lungs is recommended if you:  Currently smoke.  Have a history of at least 30 pack-years of smoking and you currently smoke or have quit within the past 15 years. A pack-year is smoking an average of one pack of cigarettes per day for one year.  Yearly screening should:  Continue until it has been 15 years since you quit.  Stop if you develop a health problem that would prevent you from having lung cancer treatment.  Colorectal Cancer  This type of cancer can be detected and can often be prevented.  Routine colorectal cancer screening usually begins at age 62 and continues through age 79.  If you have risk factors for colon cancer, your health care provider may recommend that you be screened at an earlier age.  If you have a family history of colorectal cancer, talk with your health care provider about genetic screening.  Your health care provider may also recommend using home test kits to check for hidden blood in your stool.  A small camera at the end of a tube can be used to examine your colon directly (sigmoidoscopy or colonoscopy). This is done to check for the earliest forms of colorectal cancer.  Direct examination of the colon should be repeated every 5-10 years until age 25. However, if early forms of precancerous polyps or small growths are found or if you have a family history or genetic risk for colorectal cancer, you may need to be screened more often.  Skin Cancer  Check your skin from head to toe regularly.  Monitor any moles. Be sure to tell your health care provider: ? About any new moles or changes in moles, especially if there is a change in a mole's shape or color. ? If you have a mole that is larger than the size of a pencil eraser.  If any of your family members has a history of skin cancer, especially at a young age, talk with your health care provider about genetic  screening.  Always use sunscreen. Apply sunscreen liberally and repeatedly throughout the day.  Whenever you are outside, protect yourself by wearing long sleeves, pants, a wide-brimmed hat, and sunglasses.  What should I know about osteoporosis? Osteoporosis is a condition in which bone destruction happens more quickly than new bone creation. After menopause, you may be at an increased risk for osteoporosis. To help prevent osteoporosis or the bone fractures that can happen because of osteoporosis, the following is recommended:  If you are 43-73 years old, get at least 1,000 mg of calcium and at least 600 mg of vitamin D per day.  If you are older than age 14 but younger than age 29, get at least 1,200 mg of calcium and at least 600 mg of vitamin D per day.  If you  are older than age 54, get at least 1,200 mg of calcium and at least 800 mg of vitamin D per day.  Smoking and excessive alcohol intake increase the risk of osteoporosis. Eat foods that are rich in calcium and vitamin D, and do weight-bearing exercises several times each week as directed by your health care provider. What should I know about how menopause affects my mental health? Depression may occur at any age, but it is more common as you become older. Common symptoms of depression include:  Low or sad mood.  Changes in sleep patterns.  Changes in appetite or eating patterns.  Feeling an overall lack of motivation or enjoyment of activities that you previously enjoyed.  Frequent crying spells.  Talk with your health care provider if you think that you are experiencing depression. What should I know about immunizations? It is important that you get and maintain your immunizations. These include:  Tetanus, diphtheria, and pertussis (Tdap) booster vaccine.  Influenza every year before the flu season begins.  Pneumonia vaccine.  Shingles vaccine.  Your health care provider may also recommend other  immunizations. This information is not intended to replace advice given to you by your health care provider. Make sure you discuss any questions you have with your health care provider. Document Released: 05/11/2005 Document Revised: 10/07/2015 Document Reviewed: 12/21/2014 Elsevier Interactive Patient Education  2018 [employer].

## 2017-04-08 NOTE — Progress Notes (Signed)
Michelle Romero March 19, 1956 [id number]   History:    62 y.o. G2P2 Married  RP:  Established patient for annual gyn exam and Pelvic US  HPI: Menopause, well on no hormone replacement therapy.  No postmenopausal bleeding.  No pelvic pain.  Normal vaginal secretions.  Breasts normal.  Mild urgency.  But no urine leakage.  No stress urinary incontinence.  Better when drinks less caffeine.  Improved with physical therapy.  Currently doing Kegle exercises.  Sister with ovarian cancer at age 74.  Past medical history,surgical history, family history and social history were all reviewed and documented in the EPIC chart.  Gynecologic History No LMP recorded. Patient is postmenopausal. Contraception: post menopausal status Last Pap: 02/2016. Results were: normal/HPV HR neg Last mammogram: 02/2016. Results were: Negative [facility] 2011 No recent Bone Density  Obstetric History OB History  Gravida Para Term Preterm AB Living  2 2       2   SAB TAB Ectopic Multiple Live Births               # Outcome Date GA Lbr Len/2nd Weight Sex Delivery Anes PTL Lv  2 Para           1 Para                ROS: A ROS was performed and pertinent positives and negatives are included in the history.  GENERAL: No fevers or chills. HEENT: No change in vision, no earache, sore throat or sinus congestion. NECK: No pain or stiffness. CARDIOVASCULAR: No chest pain or pressure. No palpitations. PULMONARY: No shortness of breath, cough or wheeze. GASTROINTESTINAL: No abdominal pain, nausea, vomiting or diarrhea, melena or bright red blood per rectum. GENITOURINARY: No urinary frequency, urgency, hesitancy or dysuria. MUSCULOSKELETAL: No joint or muscle pain, no back pain, no recent trauma. DERMATOLOGIC: No rash, no itching, no lesions. ENDOCRINE: No polyuria, polydipsia, no heat or cold intolerance. No recent change in weight. HEMATOLOGICAL: No anemia or easy bruising or bleeding. NEUROLOGIC: No headache, seizures,  numbness, tingling or weakness. PSYCHIATRIC: No depression, no loss of interest in normal activity or change in sleep pattern.     Exam:   BP 138/82   Ht 5\' 10"  (1.778 m)   Wt 203 lb (92.1 kg)   BMI 29.13 kg/m   Body mass index is 29.13 kg/m.  General appearance : Well developed well nourished female. No acute distress HEENT: Eyes: no retinal hemorrhage or exudates,  Neck supple, trachea midline, no carotid bruits, no thyroidmegaly Lungs: Clear to auscultation, no rhonchi or wheezes, or rib retractions  Heart: Regular rate and rhythm, no murmurs or gallops Breast:Examined in sitting and supine position were symmetrical in appearance, no palpable masses or tenderness,  no skin retraction, no nipple inversion, no nipple discharge, no skin discoloration, no axillary or supraclavicular lymphadenopathy Abdomen: no palpable masses or tenderness, no rebound or guarding Extremities: no edema or skin discoloration or tenderness  Pelvic: Vulva normal  Bartholin, Urethra, Skene Glands: Within normal limits             Vagina: No gross lesions or discharge  Cervix: No gross lesions or discharge  Uterus  AV, upper normal size, shape and consistency, non-tender and mobile  Adnexa  Without masses or tenderness  Anus and perineum  normal  Pelvic US today: T/V anteverted uterus measuring 6.34 x 4.84 x 2.81 cm.  Endometrial lining normal at 3.5 mm.  Left subserosal fibroid measuring 4.4 x 2.8 x 3.6  cm.  Feeder vessel on the left seen.  Right ovary seen with a solid focus measuring 9 x 8 mm.  Limited image due to bowel activity.  Left ovary normal.  No free fluid in the cul-de-sac.   Assessment/Plan:  62 y.o. female for annual exam   1. Well female exam with routine gynecological exam Normal gynecologic exam with a small fibroid uterus.  Pap test negative with high risk HPV negative November 2017.  Will repeat Pap test next year.  Breast exam normal.  Patient had a negative mammogram at [facility]  November 2017 that was negative.  She will schedule her next mammogram now.  Colonoscopy in 2011.  Bone density to organize here.  2. Menopause present Menopause well-tolerated on no hormone replacement therapy.  No postmenopausal bleeding.  Vitamin D supplements and calcium rich nutrition recommended.  Patient is physically active with horseback riding and walking.  3. Fibroid No significant change in uterine fibroid measuring 4.4 cm currently.  As symptomatic.  4. Family history of ovarian cancer Family history of ovarian cancer in sister at 75 year old.  Pelvic ultrasound today is reassuring but a small solid focus measuring 9 mm is seen on the right ovary.  No increased blood flow, but suboptimal views due to bowel activity.  Will do Ca1 25 today and repeat pelvic ultrasound in 3 months.  Findings discussed with patient who agrees with plan. - CA 125 - US Transvaginal Non-OB; Future  Princess Bruins MD, 11:46 AM 04/08/2017

## 2017-04-09 LAB — CA 125: CA 125: 6 U/mL (ref <35)

## 2017-05-27 ENCOUNTER — Other Ambulatory Visit: Payer: Self-pay | Admitting: Gynecology

## 2017-05-27 DIAGNOSIS — Z13820 Encounter for screening for osteoporosis: Secondary | ICD-10-CM

## 2017-05-31 DIAGNOSIS — M8580 Other specified disorders of bone density and structure, unspecified site: Secondary | ICD-10-CM

## 2017-05-31 HISTORY — DX: Other specified disorders of bone density and structure, unspecified site: M85.80

## 2017-06-03 ENCOUNTER — Telehealth: Payer: Self-pay | Admitting: Gynecology

## 2017-06-03 NOTE — Telephone Encounter (Addendum)
PC to pt left a message on her VM per her DPR regarding her bone density . She had called and asked about her hip replacement and I explained that I will replace this image of hip with an image for her forearm. I instructed her to call back if she had further questions.

## 2017-06-05 ENCOUNTER — Ambulatory Visit (INDEPENDENT_AMBULATORY_CARE_PROVIDER_SITE_OTHER): Payer: Self-pay

## 2017-06-05 ENCOUNTER — Encounter: Payer: Self-pay | Admitting: Gynecology

## 2017-06-05 DIAGNOSIS — Z13820 Encounter for screening for osteoporosis: Secondary | ICD-10-CM

## 2017-06-05 DIAGNOSIS — M8589 Other specified disorders of bone density and structure, multiple sites: Secondary | ICD-10-CM

## 2017-06-06 ENCOUNTER — Other Ambulatory Visit: Payer: Self-pay | Admitting: Gynecology

## 2017-06-06 DIAGNOSIS — M8589 Other specified disorders of bone density and structure, multiple sites: Secondary | ICD-10-CM

## 2017-07-10 ENCOUNTER — Encounter: Payer: Self-pay | Admitting: Obstetrics & Gynecology

## 2017-07-10 ENCOUNTER — Ambulatory Visit (INDEPENDENT_AMBULATORY_CARE_PROVIDER_SITE_OTHER): Payer: Self-pay | Admitting: Obstetrics & Gynecology

## 2017-07-10 ENCOUNTER — Ambulatory Visit (INDEPENDENT_AMBULATORY_CARE_PROVIDER_SITE_OTHER): Payer: Self-pay

## 2017-07-10 VITALS — BP 150/86

## 2017-07-10 DIAGNOSIS — Z8041 Family history of malignant neoplasm of ovary: Secondary | ICD-10-CM

## 2017-07-10 DIAGNOSIS — N838 Other noninflammatory disorders of ovary, fallopian tube and broad ligament: Secondary | ICD-10-CM

## 2017-07-10 DIAGNOSIS — N839 Noninflammatory disorder of ovary, fallopian tube and broad ligament, unspecified: Secondary | ICD-10-CM

## 2017-07-10 NOTE — Progress Notes (Signed)
    Michelle Romero 1955/09/20 [id number]        62 y.o.  G2P2   RP: Solid focus on Rt Ovary 9 x 8 mm  HPI: Menopause, well on no hormone replacement therapy.  No postmenopausal bleeding.  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  On last pelvic ultrasound January 2019, right ovary with a small solid focus measuring 9 x 8 mm.  No pelvic pain, asymptomatic.   OB History  Gravida Para Term Preterm AB Living  2 2       2   SAB TAB Ectopic Multiple Live Births               # Outcome Date GA Lbr Len/2nd Weight Sex Delivery Anes PTL Lv  2 Para           1 Para             Past medical history,surgical history, problem list, medications, allergies, family history and social history were all reviewed and documented in the EPIC chart.   Directed ROS with pertinent positives and negatives documented in the history of present illness/assessment and plan.  Exam:  Vitals:   07/10/17 1445  BP: (!) 150/86   General appearance:  Normal  Pelvic US today: T/V images.  Anteverted uterus measuring 6.78 x 6.83 x 2.93 cm.  Subserosal fibroid on the left measuring 4.1 x 2.8 x 3.7 cm with no change compared to last pelvic ultrasound.  Endometrial line normal and thin at 3.2 mm.  Right and left ovary not seen on today's exam.  No apparent mass seen in the right or left adnexa.  No free fluid in the posterior cul-de-sac.   Assessment/Plan:  62 y.o. G2P2   1. Ovarian mass, right Very small right ovarian focus measuring 9 x 8 mm on the last pelvic ultrasound January 2019.  Patient is completely asymptomatic.  On today's ultrasound, the ovaries are not visualized.  No apparent mass on the right or left adnexa.  No free fluid in the posterior cul-de-sac.  The endometrial lining was thin and the subserosal fibroid was stable.  Patient was reassured that no mass was visualized on pelvic ultrasound and no free fluid was present.  Given that the ovaries were not visualized on today's exam, the decision was  made to proceed with a pelvic ultrasound at the radiology center for next Pelvic US with Annual/Gyn visit.  Counseling on above issues and coordination of care more than 50% for 15 minutes.  Princess Bruins MD, 2:55 PM 07/10/2017

## 2017-07-14 ENCOUNTER — Encounter: Payer: Self-pay | Admitting: Obstetrics & Gynecology

## 2017-07-14 NOTE — Patient Instructions (Signed)
1. Ovarian mass, right Very small right ovarian focus measuring 9 x 8 mm on the last pelvic ultrasound January 2019.  Patient is completely asymptomatic.  On today's ultrasound, the ovaries are not visualized.  No apparent mass on the right or left adnexa.  No free fluid in the posterior cul-de-sac.  The endometrial lining was thin and the subserosal fibroid was stable.  Patient was reassured that no mass was visualized on pelvic ultrasound and no free fluid was present.  Given that the ovaries were not visualized on today's exam, the decision was made to proceed with a pelvic ultrasound at the radiology center for next Pelvic US with Annual/Gyn visit.  Jocelyn Lamer, good seeing you today!

## 2017-09-23 ENCOUNTER — Ambulatory Visit (INDEPENDENT_AMBULATORY_CARE_PROVIDER_SITE_OTHER): Payer: Self-pay | Admitting: Family Medicine

## 2017-09-23 ENCOUNTER — Encounter: Payer: Self-pay | Admitting: Family Medicine

## 2017-09-23 VITALS — BP 132/83 | HR 63 | Temp 98.1°F | Resp 20 | Ht 70.0 in | Wt 192.0 lb

## 2017-09-23 DIAGNOSIS — E559 Vitamin D deficiency, unspecified: Secondary | ICD-10-CM

## 2017-09-23 DIAGNOSIS — E538 Deficiency of other specified B group vitamins: Secondary | ICD-10-CM

## 2017-09-23 DIAGNOSIS — R0683 Snoring: Secondary | ICD-10-CM

## 2017-09-23 DIAGNOSIS — W57XXXD Bitten or stung by nonvenomous insect and other nonvenomous arthropods, subsequent encounter: Secondary | ICD-10-CM

## 2017-09-23 DIAGNOSIS — R5383 Other fatigue: Secondary | ICD-10-CM

## 2017-09-23 DIAGNOSIS — E663 Overweight: Secondary | ICD-10-CM | POA: Insufficient documentation

## 2017-09-23 LAB — CBC
HEMATOCRIT: 39.1 % (ref 36.0–46.0)
HEMOGLOBIN: 13.3 g/dL (ref 12.0–15.0)
MCHC: 34.1 g/dL (ref 30.0–36.0)
MCV: 91.7 fl (ref 78.0–100.0)
Platelets: 279 10*3/uL (ref 150.0–400.0)
RBC: 4.26 Mil/uL (ref 3.87–5.11)
RDW: 13.5 % (ref 11.5–15.5)
WBC: 8.3 10*3/uL (ref 4.0–10.5)

## 2017-09-23 LAB — COMPREHENSIVE METABOLIC PANEL
ALBUMIN: 4.1 g/dL (ref 3.5–5.2)
ALT: 11 U/L (ref 0–35)
AST: 12 U/L (ref 0–37)
Alkaline Phosphatase: 109 U/L (ref 39–117)
BUN: 14 mg/dL (ref 6–23)
CALCIUM: 9.6 mg/dL (ref 8.4–10.5)
CHLORIDE: 103 meq/L (ref 96–112)
CO2: 30 mEq/L (ref 19–32)
Creatinine, Ser: 0.62 mg/dL (ref 0.40–1.20)
GFR: 103.3 mL/min (ref >60.00)
Glucose, Bld: 94 mg/dL (ref 70–99)
POTASSIUM: 4.5 meq/L (ref 3.5–5.1)
SODIUM: 140 meq/L (ref 135–145)
Total Bilirubin: 0.6 mg/dL (ref 0.2–1.2)
Total Protein: 6.5 g/dL (ref 6.0–8.3)

## 2017-09-23 LAB — VITAMIN D 25 HYDROXY (VIT D DEFICIENCY, FRACTURES): VITD: 42.58 ng/mL (ref 30.00–100.00)

## 2017-09-23 LAB — VITAMIN B12: Vitamin B-12: >1500 pg/mL — ABNORMAL HIGH (ref 211–911)

## 2017-09-23 LAB — HEMOGLOBIN A1C: HEMOGLOBIN A1C: 5.7 % (ref 4.6–6.5)

## 2017-09-23 LAB — TSH: TSH: 1.73 u[IU]/mL (ref 0.35–4.50)

## 2017-09-23 NOTE — Patient Instructions (Signed)
We will call you with your lab results once they have all resulted.  These can take a week to result.    Fatigue Fatigue is feeling tired all of the time, a lack of energy, or a lack of motivation. Occasional or mild fatigue is often a normal response to activity or life in general. However, long-lasting (chronic) or extreme fatigue may indicate an underlying medical condition. Follow these instructions at home: Watch your fatigue for any changes. The following actions may help to lessen any discomfort you are feeling:  Talk to your health care provider about how much sleep you need each night. Try to get the required amount every night.  Take medicines only as directed by your health care provider.  Eat a healthy and nutritious diet. Ask your health care provider if you need help changing your diet.  Drink enough fluid to keep your urine clear or pale yellow.  Practice ways of relaxing, such as yoga, meditation, massage therapy, or acupuncture.  Exercise regularly.  Change situations that cause you stress. Try to keep your work and personal routine reasonable.  Do not abuse illegal drugs.  Limit alcohol intake to no more than 1 drink per day for nonpregnant women and 2 drinks per day for men. One drink equals 12 ounces of beer, 5 ounces of wine, or 1 ounces of hard liquor.  Take a multivitamin, if directed by your health care provider.  Contact a health care provider if:  Your fatigue does not get better.  You have a fever.  You have unintentional weight loss or gain.  You have headaches.  You have difficulty: ? Falling asleep. ? Sleeping throughout the night.  You feel angry, guilty, anxious, or sad.  You are unable to have a bowel movement (constipation).  You skin is dry.  Your legs or another part of your body is swollen. Get help right away if:  You feel confused.  Your vision is blurry.  You feel faint or pass out.  You have a severe headache.  You  have severe abdominal, pelvic, or back pain.  You have chest pain, shortness of breath, or an irregular or fast heartbeat.  You are unable to urinate or you urinate less than normal.  You develop abnormal bleeding, such as bleeding from the rectum, vagina, nose, lungs, or nipples.  You vomit blood.  You have thoughts about harming yourself or committing suicide.  You are worried that you might harm someone else. This information is not intended to replace advice given to you by your health care provider. Make sure you discuss any questions you have with your health care provider. Document Released: 01/14/2007 Document Revised: 08/25/2015 Document Reviewed: 07/21/2013 Elsevier Interactive Patient Education  [employer].

## 2017-09-23 NOTE — Progress Notes (Signed)
Michelle Romero , [birth year], 62 y.o., female MRN: [id number] Patient Care Team    Relationship Specialty Notifications Start End  Ma Hillock, DO PCP - General Family Medicine  12/09/15   Princess Bruins, MD Consulting Physician Obstetrics and Gynecology  12/09/15     Chief Complaint  Patient presents with  . Fatigue    ongoing for 1 year     Subjective: Pt presents for an OV with complaints of fatigue that has been progressing for over a year. She has concerns her aches and pains, fatigue, slow digestion (BM every 3 days) and shortness of breath are associated with something else than her  hip bursitis/arthritis diagnosis. She denies fever, chills, unintentional weight loss, rash, headache, dyspnea on exertion or chest pain. She reports she does snore. She feels walking to the mailbox causes her to be winded. She is worried about tick bourne disease since she has been exposed to many. She denies h/o COPD or asthma. She has never smoked personally. She reports she had a negative rheumatoid factor in the past.  Depression screen [facility] 2/9 03/15/2017  Decreased Interest 0  Down, Depressed, Hopeless 0  PHQ - 2 Score 0    Allergies  Allergen Reactions  . Codeine Other (See Comments)    Fainting  . Meloxicam     Vertigo, dizziness, sweating, chest tightness, diarrhea, dehydrated from being outside   Social History   Tobacco Use  . Smoking status: Never Smoker  . Smokeless tobacco: Never Used  Substance Use Topics  . Alcohol use: Yes    Alcohol/week: 0.6 oz    Types: 1 Standard drinks or equivalent per week    Comment: rarely   Past Medical History:  Diagnosis Date  . Abdominal mass, right upper quadrant 01/30/2012  . Allergic state 01/30/2012  . 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 Chicken pox as a child  . DJD (degenerative joint disease) of hip 03/17/2012  . Dupuytren's contracture of hand 01/30/2012  . Elevated BP 11/30/2011  . GERD (gastroesophageal reflux disease)   .  Hemorrhoids 12/28/2012  . History of blood transfusion   . Hypercalcemia   . Hyperlipidemia   . Measles as a child  . Osteopenia 05/2017   T score -1.5 FRAX 8.4% / 0.8%  . PONV (postoperative nausea and vomiting)   . Precancerous skin lesion 10/30/2011   Left abdomen, excised  . Vertigo 10/30/2011  . Vitamin D deficiency    Past Surgical History:  Procedure Laterality Date  . broke wrist  62 yrs old   plate [facility] by Dr Alphonzo Cruise at [facility]  . COLONOSCOPY  2010   [facility]  . SKIN LESION EXCISION     premelanoma on left abdominal wall  . TOTAL HIP ARTHROPLASTY  04/16/2012   Procedure: TOTAL HIP ARTHROPLASTY ANTERIOR APPROACH;  Surgeon: Gearlean Alf, MD;  Location: [facility];  Service: Orthopedics;  Laterality: Right;   Family History  Problem Relation Age of Onset  . Stroke Mother   . Hypertension Mother   . Diabetes Mother        type 2  . Dementia Mother   . Arthritis Mother   . Cancer Father        melanoma- neck  . Arthritis Father   . Alzheimer's disease Father   . Fibromyalgia Sister   . Cancer Maternal Grandmother        pancreatic  . Heart attack Maternal Grandfather   . Cancer Paternal Grandmother        ?  Marland Kitchen  Heart disease Paternal Grandfather        pacemaker  . Cancer Sister 43       ovarian  . Arthritis Brother    Allergies as of 09/23/2017      Reactions   Codeine Other (See Comments)   Fainting   Meloxicam    Vertigo, dizziness, sweating, chest tightness, diarrhea, dehydrated from being outside      Medication List        Accurate as of 09/23/17 11:04 AM. Always use your most recent med list.          aspirin 500 MG tablet Take 500 mg by mouth as needed for pain.   cholecalciferol 1000 units tablet Commonly known as:  VITAMIN D Take 1,000 Units by mouth every other day.   Horse Chestnut 300 MG Caps horse chestnut   PROBIOTIC-10 PO Probiotic   Vitamin B 12 250 MCG Lozg Take 1,000 mcg by mouth 4 (four) times daily.   Vitamin D2 2000  units Tabs Vitamin D2       All past medical history, surgical history, allergies, family history, immunizations andmedications were updated in the EMR today and reviewed under the history and medication portions of their EMR.     ROS: Negative, with the exception of above mentioned in HPI   Objective:  BP 132/83 (BP Location: Left Arm, Patient Position: Sitting, Cuff Size: Normal)   Pulse 63   Temp 98.1 F (36.7 C)   Resp 20   Ht 5\' 10"  (1.778 m)   Wt 192 lb (87.1 kg)   SpO2 97%   BMI 27.55 kg/m  Body mass index is 27.55 kg/m. Gen: Afebrile. No acute distress. Nontoxic in appearance, well developed, well nourished.  HENT: AT. [place]. Bilateral TM visualized WNL. MMM, no oral lesions. Bilateral nares WNL. Throat without erythema or exudates. No cough or hoarseness.  Eyes:Pupils Equal Round Reactive to light, Extraocular movements intact,  Conjunctiva without redness, discharge or icterus. Neck/lymp/endocrine: Supple,no lymphadenopathy CV: RRR no murmur, no edema Chest: CTAB, no wheeze or crackles. Good air movement, normal resp effort.  Abd: Soft. NTND. BS present. no Masses palpated. No rebound or guarding.  MSK: Full ROM.  Skin: no rashes, purpura or petechiae.  Neuro:  Normal gait. PERLA. EOMi. Alert. Oriented x3  Psych: Normal affect, dress and demeanor. Normal speech. Normal thought content and judgment.  No exam data present No results found. No results found for this or any previous visit (from the past 24 hour(s)).  Assessment/Plan: [name] is a 62 y.o. female present for OV for  Vitamin D deficiency - Vitamin D (25 hydroxy) Overweight (BMI 25.0-29.9) Diet and exercise.  Hypercalcemia - Comprehensive metabolic panel - Vitamin D (25 hydroxy) Fatigue, unspecified type/snores - vague compliant of fatigue. She endorses snoring and shortness of breath. She has arthritis. No red flags on exam.  Rule out tick bourne disease, vitamin/mineral deficiency, thyroid  disorder. Could be deconditioning, sleep disorder or pulmonary condition by reported symptoms. Doubt cardiac by description. Discussed if labs do not point a specific direction would consider pulm for sleep study and evaluation of lung condition with shortness of breath.  - CBC - Comprehensive metabolic panel - TSH - Vitamin D (25 hydroxy) - B12 - HgB A1c - Iron, TIBC and Ferritin Panel - B. burgdorfi antibodies - Rocky mtn spotted fvr abs pnl(IgG+IgM) Tick bite, subsequent encounter - B. burgdorfi antibodies - Rocky mtn spotted fvr abs pnl(IgG+IgM) B12 deficiency - B12   Reviewed  expectations re: course of current medical issues.  Discussed self-management of symptoms.  Outlined signs and symptoms indicating need for more acute intervention.  Patient verbalized understanding and all questions were answered.  Patient received an After-Visit Summary.   > 25 minutes spent with patient, >50% of time spent face to face counseling and coordinating care.    No orders of the defined types were placed in this encounter.    Note is dictated utilizing voice recognition software. Although note has been proof read prior to signing, occasional typographical errors still can be missed. If any questions arise, please do not hesitate to call for verification.   electronically signed by:  Howard Pouch, DO  [place]

## 2017-09-24 ENCOUNTER — Telehealth: Payer: Self-pay | Admitting: Family Medicine

## 2017-09-24 ENCOUNTER — Encounter: Payer: Self-pay | Admitting: Family Medicine

## 2017-09-24 DIAGNOSIS — J309 Allergic rhinitis, unspecified: Secondary | ICD-10-CM

## 2017-09-24 DIAGNOSIS — R0683 Snoring: Secondary | ICD-10-CM

## 2017-09-24 LAB — IRON,TIBC AND FERRITIN PANEL
%SAT: 23 % (calc) (ref 16–45)
Ferritin: 67 ng/mL (ref 16–288)
Iron: 68 ug/dL (ref 45–160)
TIBC: 297 mcg/dL (calc) (ref 250–450)

## 2017-09-24 LAB — ROCKY MTN SPOTTED FVR ABS PNL(IGG+IGM)
RMSF IGG: NOT DETECTED
RMSF IgM: NOT DETECTED

## 2017-09-24 LAB — B. BURGDORFI ANTIBODIES: B burgdorferi Ab IgG+IgM: <0.9 index

## 2017-09-24 NOTE — Telephone Encounter (Signed)
Please inform patient the following information: Her labs are normal.    - normal electrolytes and kidney/liver function.    - iron levels, Vit D and B12 normal.    - no anemia. Cell counts normal.    - Thyroid normal function.    - Diabetes normal--> but high end of normal at 5.7. Almost "prediabetes" --> diet low sugar/carbs and increase in exercise will help prevent progressing to next stage. Again, still normal.     - negative lyme and RMSF.   - if she is interested in a further work up please have her follow up and we can discuss infectious diseases and autoimmune disorders and other potential causes of her symptoms.  - we also discussed a sleep disorder as potential cause--> if she is interested we can refer her to pulmonology for evaluation and sleep study now. Please advise.

## 2017-09-24 NOTE — Telephone Encounter (Signed)
Patient notified and verbalized understanding. Patient declined sleep study but is requesting ENT due to allergies being what keeps her awake at night and not being able to take any medications for this. Patient declined infectious disease at this time also.

## 2017-09-25 NOTE — Telephone Encounter (Signed)
I can place the referral to ENT for her, however she should make sure she is taking nightly antihistamine (zyrtec/xyzal) and flonase nasal spray daily-all OTC. ENT referral placed.

## 2017-09-25 NOTE — Telephone Encounter (Signed)
Patient notified and verbalized understanding. 

## 2017-10-01 DIAGNOSIS — K219 Gastro-esophageal reflux disease without esophagitis: Secondary | ICD-10-CM | POA: Insufficient documentation

## 2017-10-09 ENCOUNTER — Telehealth: Payer: Self-pay | Admitting: Family Medicine

## 2017-10-09 NOTE — Telephone Encounter (Signed)
Copied from [place] [phone]. Topic: Quick Communication - Office Called Patient >> Oct 09, 2017  9:11 AM Dorna Bloom I wrote: Reason for CRM:   Called patient to discuss labs and billing. Patient came into the office on Monday 10/07/17 with copies of her bill from both [facility] and [employer]. Pt explained to front desk staff that she felt she was being double billed for identical labs. Patient is uninsured therefore we were able to give her an uninsured discount through [facility]. Advised patient in voicemail that we cannot handle or negotiate [employer] billing and that she would need to call them directly at [phone]. She can ask them if  they have an uninsured discount that they can extend her. Patient also questioned why particular labs were run. In review of her office visit in June it was discussed with Dr. Raoul Pitch that patient felt she may have a tickborne illness. The tests that were sent to [facility] were to determine if she had levels of Lyme disease. Those are specialty labs and are not performed in the house.   If pt has clinical questions she will need to speak with clinical staff.

## 2017-10-28 ENCOUNTER — Telehealth: Payer: Self-pay

## 2017-10-28 DIAGNOSIS — K2100 Gastro-esophageal reflux disease with esophagitis, without bleeding: Secondary | ICD-10-CM

## 2017-10-28 NOTE — Telephone Encounter (Signed)
I will place a referral to GI

## 2017-10-28 NOTE — Telephone Encounter (Signed)
Copied from [place] [phone]. Topic: Referral - Request >> Oct 28, 2017  3:28 PM Conception Chancy, NT wrote: Reason for CRM: patient is calling and states she was seen by Dr. Raoul Pitch and was diagnosed with allergies. She states it was misdiagnosed and she was sent to a ENT and was diagnosed with silent reflux. Patient is requesting a referral to a gastro.

## 2017-10-29 ENCOUNTER — Encounter: Payer: Self-pay | Admitting: Gastroenterology

## 2017-10-29 NOTE — Telephone Encounter (Signed)
Patient aware appt has been scheduled.

## 2017-10-30 ENCOUNTER — Ambulatory Visit (INDEPENDENT_AMBULATORY_CARE_PROVIDER_SITE_OTHER): Payer: Self-pay | Admitting: Family Medicine

## 2017-10-30 ENCOUNTER — Encounter: Payer: Self-pay | Admitting: Family Medicine

## 2017-10-30 VITALS — BP 116/75 | HR 58 | Temp 98.0°F | Resp 20 | Ht 70.0 in | Wt 189.5 lb

## 2017-10-30 DIAGNOSIS — J3089 Other allergic rhinitis: Secondary | ICD-10-CM

## 2017-10-30 DIAGNOSIS — K219 Gastro-esophageal reflux disease without esophagitis: Secondary | ICD-10-CM

## 2017-10-30 MED ORDER — OMEPRAZOLE 40 MG PO CPDR: 40.0000 mg | DELAYED_RELEASE_CAPSULE | Freq: Every day | ORAL | 0 refills | Status: DC

## 2017-10-30 MED ORDER — RANITIDINE HCL 150 MG PO TABS: 150.0000 mg | ORAL_TABLET | Freq: Two times a day (BID) | ORAL | 1 refills | Status: DC

## 2017-10-30 MED ORDER — HYDROXYZINE PAMOATE 25 MG PO CAPS: 25.0000 mg | ORAL_CAPSULE | Freq: Every day | ORAL | 5 refills | Status: DC

## 2017-10-30 NOTE — Progress Notes (Signed)
Michelle Romero , 1956-02-21, 62 y.o., female MRN: [id number] Patient Care Team    Relationship Specialty Notifications Start End  Ma Hillock, DO PCP - General Family Medicine  12/09/15   Princess Bruins, MD Consulting Physician Obstetrics and Gynecology  12/09/15     Chief Complaint  Patient presents with  . Gastroesophageal Reflux     Subjective:  Michelle Romero is a 62 y.o.  Pt presents to clinic again today after her ENT appt for her symptoms (see below). She was seen here last month with multiple complaints mostly surrounding shortness of breath and fatigue. She has had a h/o allergies over the last 4-5 years per pt. She was treated with allergy medications and saw little improvement. She returned to using the benadryl and saw improvement but not resolution. After a negative work up fpr other causes she states she went ti the urgent care a few days after our last appt for increased phlegm prdx, fatigue and not feeling well. She states they checked her blood, urine and cxr which were all normal and they felt she was sleep deprived.  She has adamantly denied and GERD symptoms or heartburn. She was offered referral to pulm for her fatigue and snoring. She declined and asked for ENT. She established with ENT (while on antihistamines) and was told she had silent GERD and started on omperazole 20 mg Qd. She does not feel like this has b=made any difference either. She asked for a GI referral for the GERD, but was unable to get in until the end of next month, so she came here. She is frustrated and wants to feel better, and wants to sleep.   Prior note:  Pt presents for an OV with complaints of fatigue that has been progressing for over a year. She has concerns her aches and pains, fatigue, slow digestion (BM every 3 days) and shortness of breath are associated with something else than her  hip bursitis/arthritis diagnosis. She denies fever, chills, unintentional weight loss, rash,  headache, dyspnea on exertion or chest pain. She reports she does snore. She feels walking to the mailbox causes her to be winded. She is worried about tick bourne disease since she has been exposed to many. She denies h/o COPD or asthma. She has never smoked personally. She reports she had a negative rheumatoid factor in the past.  Depression screen [facility] 2/9 03/15/2017  Decreased Interest 0  Down, Depressed, Hopeless 0  PHQ - 2 Score 0    Allergies  Allergen Reactions  . Codeine Other (See Comments)    Fainting  . Meloxicam     Vertigo, dizziness, sweating, chest tightness, diarrhea, dehydrated from being outside   Social History   Tobacco Use  . Smoking status: Never Smoker  . Smokeless tobacco: Never Used  Substance Use Topics  . Alcohol use: Yes    Alcohol/week: 0.6 oz    Types: 1 Standard drinks or equivalent per week    Comment: rarely   Past Medical History:  Diagnosis Date  . Abdominal mass, right upper quadrant 01/30/2012  . Allergic state 01/30/2012  . 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 Chicken pox as a child  . DJD (degenerative joint disease) of hip 03/17/2012  . Dupuytren's contracture of hand 01/30/2012  . Elevated BP 11/30/2011  . GERD (gastroesophageal reflux disease)   . Hemorrhoids 12/28/2012  . History of blood transfusion   . Hypercalcemia   . Hyperlipidemia   . Measles  as a child  . Osteopenia 05/2017   T score -1.5 FRAX 8.4% / 0.8%  . PONV (postoperative nausea and vomiting)   . Precancerous skin lesion 10/30/2011   Left abdomen, excised  . Vertigo 10/30/2011  . Vitamin D deficiency    Past Surgical History:  Procedure Laterality Date  . broke wrist  62 yrs old   plate placed by Dr Alphonzo Cruise at [facility]  . COLONOSCOPY  2010   [facility]  . SKIN LESION EXCISION     premelanoma on left abdominal wall  . TOTAL HIP ARTHROPLASTY  04/16/2012   Procedure: TOTAL HIP ARTHROPLASTY ANTERIOR APPROACH;  Surgeon: Gearlean Alf, MD;  Location: [facility];  Service:  Orthopedics;  Laterality: Right;   Family History  Problem Relation Age of Onset  . Stroke Mother   . Hypertension Mother   . Diabetes Mother        type 2  . Dementia Mother   . Arthritis Mother   . Cancer Father        melanoma- neck  . Arthritis Father   . Alzheimer's disease Father   . Fibromyalgia Sister   . Cancer Maternal Grandmother        pancreatic  . Heart attack Maternal Grandfather   . Cancer Paternal Grandmother        ?  Marland Kitchen Heart disease Paternal Grandfather        pacemaker  . Cancer Sister 55       ovarian  . Arthritis Brother    Allergies as of 10/30/2017      Reactions   Codeine Other (See Comments)   Fainting   Meloxicam    Vertigo, dizziness, sweating, chest tightness, diarrhea, dehydrated from being outside      Medication List        Accurate as of 10/30/17  4:44 PM. Always use your most recent med list.          acetaminophen 500 MG tablet Commonly known as:  TYLENOL Take 500 mg by mouth every 6 (six) hours as needed.   cholecalciferol 1000 units tablet Commonly known as:  VITAMIN D Take 1,000 Units by mouth every other day.   guaiFENesin 600 MG 12 hr tablet Commonly known as:  MUCINEX Take by mouth 2 (two) times daily.   hydrOXYzine 25 MG capsule Commonly known as:  VISTARIL Take 1-2 capsules (25-50 mg total) by mouth at bedtime.   omeprazole 40 MG capsule Commonly known as:  PRILOSEC Take 1 capsule (40 mg total) by mouth daily.   ranitidine 150 MG tablet Commonly known as:  ZANTAC Take 1 tablet (150 mg total) by mouth 2 (two) times daily.   Vitamin B 12 250 MCG Lozg Take 1,000 mcg by mouth 4 (four) times daily.       All past medical history, surgical history, allergies, family history, immunizations andmedications were updated in the EMR today and reviewed under the history and medication portions of their EMR.     ROS: Negative, with the exception of above mentioned in HPI   Objective:  BP 116/75 (BP Location:  Right Arm, Patient Position: Sitting, Cuff Size: Large)   Pulse (!) 58   Temp 98 F (36.7 C)   Resp 20   Ht 5\' 10"  (1.778 m)   Wt 189 lb 8 oz (86 kg)   SpO2 97%   BMI 27.19 kg/m  Body mass index is 27.19 kg/m. Gen: Afebrile. No acute distress. Nontoxic in appearance. frustrated patient. HENT:  AT. [place]. Bilateral TM visualized and normal in appearance. MMM. Bilateral nares without erythema, swelling or drainage. Throat without erythema or exudates. No cough, no hoarseness.  Eyes:Pupils Equal Round Reactive to light, Extraocular movements intact,  Conjunctiva without redness, discharge or icterus. Neck/lymp/endocrine: Supple,no lymphadenopathy CV: RRR no murmur, no edema, +2/4 P posterior tibialis pulses Chest: CTAB, no wheeze or crackles Abd: Soft. NTND. BS present.  Skin: no rashes, purpura or petechiae.  Neuro:  Normal gait. PERLA. EOMi. Alert. Oriented.   No exam data present No results found. No results found for this or any previous visit (from the past 24 hour(s)).  Assessment/Plan: NEESHA LANGTON is a 62 y.o. female present for OV for  GERD/allergies/fatigue/Shortness of breath:   - vague compliant of fatigue. She endorses snoring and shortness of breath. Could consider cardio referral, however she is not volume overloaded on exam, normal cxr at [facility] (perpt). Now with concerns over possible silent GERD as cause (per ENT), although not seeing benefit w/ GERD therapy either. She is frustrated today. Pilar Plate discussion surrounding her constellation of symptoms and the potential for allergies/GERD, another cause or mix of each as potential. It takes time to try different therapies and seek the help of specialist that can perform additional test to further investigate. Anxiety likely plays at least a small role as well.  - Increase GERD regimen until able to be seen by GI.  - Stop benadryl and start vistaril QHS. Hopefully this will help her sleep/anxiety and allergies. She does not  believe any 2nd gen antihistamines work.  - continue mucinex before bed  Omeprazole increase to 40 mg a day.  prescribed zantac - take every 12 hours.  Prescribed  vistaril - start with 1-2 tab about an hour before bed.  GERD Diet education provided. She states she is already doing that from the ENT visit and no changes.  - F/u with GI scheduled.      Reviewed expectations re: course of current medical issues.  Discussed self-management of symptoms.  Outlined signs and symptoms indicating need for more acute intervention.  Patient verbalized understanding and all questions were answered.  Patient received an After-Visit Summary.   > 25 minutes spent with patient, >50% of time spent face to face counseling and coordinating care.    No orders of the defined types were placed in this encounter.    Note is dictated utilizing voice recognition software. Although note has been proof read prior to signing, occasional typographical errors still can be missed. If any questions arise, please do not hesitate to call for verification.   electronically signed by:  Howard Pouch, DO  [place]

## 2017-10-30 NOTE — Patient Instructions (Addendum)
Omeprazole increase to 40 mg a day.  I also called in zantac - take every 12 hours.  Also called in vistaril (hydroxine) start with 1 tab about an hour before bed. If it does not make you too sleepy and you feel you could use more you can increase to 2 tabs before bed.   This is frustrating, I understand. Hopefully GI can get to the bottom of it.    Gastroesophageal Reflux Disease, Adult Normally, food travels down the esophagus and stays in the stomach to be digested. If a person has gastroesophageal reflux disease (GERD), food and stomach acid move back up into the esophagus. When this happens, the esophagus becomes sore and swollen (inflamed). Over time, GERD can make small holes (ulcers) in the lining of the esophagus. Follow these instructions at home: Diet  Follow a diet as told by your doctor. You may need to avoid foods and drinks such as: ? Coffee and tea (with or without caffeine). ? Drinks that contain alcohol. ? Energy drinks and sports drinks. ? Carbonated drinks or sodas. ? Chocolate and cocoa. ? Peppermint and mint flavorings. ? Garlic and onions. ? Horseradish. ? Spicy and acidic foods, such as peppers, chili powder, curry powder, vinegar, hot sauces, and BBQ sauce. ? Citrus fruit juices and citrus fruits, such as oranges, lemons, and limes. ? Tomato-based foods, such as red sauce, chili, salsa, and pizza with red sauce. ? Fried and fatty foods, such as donuts, french fries, potato chips, and high-fat dressings. ? High-fat meats, such as hot dogs, rib eye steak, sausage, ham, and bacon. ? High-fat dairy items, such as whole milk, butter, and cream cheese.  Eat small meals often. Avoid eating large meals.  Avoid drinking large amounts of liquid with your meals.  Avoid eating meals during the 2-3 hours before bedtime.  Avoid lying down right after you eat.  Do not exercise right after you eat. General instructions  Pay attention to any changes in your  symptoms.  Take over-the-counter and prescription medicines only as told by your doctor. Do not take aspirin, ibuprofen, or other NSAIDs unless your doctor says it is okay.  Do not use any tobacco products, including cigarettes, chewing tobacco, and e-cigarettes. If you need help quitting, ask your doctor.  Wear loose clothes. Do not wear anything tight around your waist.  Raise (elevate) the head of your bed about 6 inches (15 cm).  Try to lower your stress. If you need help doing this, ask your doctor.  If you are overweight, lose an amount of weight that is healthy for you. Ask your doctor about a safe weight loss goal.  Keep all follow-up visits as told by your doctor. This is important. Contact a doctor if:  You have new symptoms.  You lose weight and you do not know why it is happening.  You have trouble swallowing, or it hurts to swallow.  You have wheezing or a cough that keeps happening.  Your symptoms do not get better with treatment.  You have a hoarse voice. Get help right away if:  You have pain in your arms, neck, jaw, teeth, or back.  You feel sweaty, dizzy, or light-headed.  You have chest pain or shortness of breath.  You throw up (vomit) and your throw up looks like blood or coffee grounds.  You pass out (faint).  Your poop (stool) is bloody or black.  You cannot swallow, drink, or eat. This information is not intended to replace advice  given to you by your health care provider. Make sure you discuss any questions you have with your health care provider. Document Released: 09/05/2007 Document Revised: 08/25/2015 Document Reviewed: 07/14/2014 Elsevier Interactive Patient Education  [employer].

## 2017-11-22 ENCOUNTER — Ambulatory Visit: Payer: Self-pay | Admitting: Gastroenterology

## 2017-11-22 ENCOUNTER — Encounter: Payer: Self-pay | Admitting: Gastroenterology

## 2017-11-22 VITALS — BP 126/74 | HR 72 | Ht 69.75 in | Wt 185.0 lb

## 2017-11-22 DIAGNOSIS — K588 Other irritable bowel syndrome: Secondary | ICD-10-CM

## 2017-11-22 DIAGNOSIS — R1110 Vomiting, unspecified: Secondary | ICD-10-CM

## 2017-11-22 DIAGNOSIS — K219 Gastro-esophageal reflux disease without esophagitis: Secondary | ICD-10-CM

## 2017-11-22 DIAGNOSIS — K5900 Constipation, unspecified: Secondary | ICD-10-CM

## 2017-11-22 DIAGNOSIS — J029 Acute pharyngitis, unspecified: Secondary | ICD-10-CM

## 2017-11-22 MED ORDER — RANITIDINE HCL 150 MG PO TABS: 150.0000 mg | ORAL_TABLET | Freq: Every day | ORAL | 3 refills | Status: DC

## 2017-11-22 MED ORDER — PANTOPRAZOLE SODIUM 40 MG PO TBEC: 40.0000 mg | DELAYED_RELEASE_TABLET | Freq: Every day | ORAL | 3 refills | Status: DC

## 2017-11-22 NOTE — Patient Instructions (Addendum)
You have been scheduled for a Barium Esophogram at [facility] Radiology (1st floor of the hospital) on 11/29/2017 at 11am. Please arrive 15 minutes prior to your appointment for registration. Make certain not to have anything to eat or drink 4 hours prior to your test. If you need to reschedule for any reason, please contact radiology at [phone] to do so. __________________________________________________________________ A barium swallow is an examination that concentrates on views of the esophagus. This tends to be a double contrast exam (barium and two liquids which, when combined, create a gas to distend the wall of the oesophagus) or single contrast (non-ionic iodine based). The study is usually tailored to your symptoms so a good history is essential. Attention is paid during the study to the form, structure and configuration of the esophagus, looking for functional disorders (such as aspiration, dysphagia, achalasia, motility and reflux) EXAMINATION You may be asked to change into a gown, depending on the type of swallow being performed. A radiologist and radiographer will perform the procedure. The radiologist will advise you of the type of contrast selected for your procedure and direct you during the exam. You will be asked to stand, sit or lie in several different positions and to hold a small amount of fluid in your mouth before being asked to swallow while the imaging is performed .In some instances you may be asked to swallow barium coated marshmallows to assess the motility of a solid food bolus. The exam can be recorded as a digital or video fluoroscopy procedure. POST PROCEDURE It will take 1-2 days for the barium to pass through your system. To facilitate this, it is important, unless otherwise directed, to increase your fluids for the next 24-48hrs and to resume your normal diet.  This test typically takes about 30 minutes to  perform. _____________________________________________________________________________   Take benefiber 1 teaspoon three times a day with meals  We have sent Protonix and Zantac to your pharmacy  Use Allegra 1 tablet daily  Use OTC Gaviscon 1 tablet three times a day with meals as needed    Take Restora probiotic 1 daily   Gastroesophageal Reflux Disease, Adult Normally, food travels down the esophagus and stays in the stomach to be digested. However, when a person has gastroesophageal reflux disease (GERD), food and stomach acid move back up into the esophagus. When this happens, the esophagus becomes sore and inflamed. Over time, GERD can create small holes (ulcers) in the lining of the esophagus. What are the causes? This condition is caused by a problem with the muscle between the esophagus and the stomach (lower esophageal sphincter, or LES). Normally, the LES muscle closes after food passes through the esophagus to the stomach. When the LES is weakened or abnormal, it does not close properly, and that allows food and stomach acid to go back up into the esophagus. The LES can be weakened by certain dietary substances, medicines, and medical conditions, including:  Tobacco use.  Pregnancy.  Having a hiatal hernia.  Heavy alcohol use.  Certain foods and beverages, such as coffee, chocolate, onions, and peppermint.  What increases the risk? This condition is more likely to develop in:  People who have an increased body weight.  People who have connective tissue disorders.  People who use NSAID medicines.  What are the signs or symptoms? Symptoms of this condition include:  Heartburn.  Difficult or painful swallowing.  The feeling of having a lump in the throat.  Abitter taste in the mouth.  Bad  breath.  Having a large amount of saliva.  Having an upset or bloated stomach.  Belching.  Chest pain.  Shortness of breath or wheezing.  Ongoing (chronic)  cough or a night-time cough.  Wearing away of tooth enamel.  Weight loss.  Different conditions can cause chest pain. Make sure to see your health care provider if you experience chest pain. How is this diagnosed? Your health care provider will take a medical history and perform a physical exam. To determine if you have mild or severe GERD, your health care provider may also monitor how you respond to treatment. You may also have other tests, including:  An endoscopy toexamine your stomach and esophagus with a small camera.  A test thatmeasures the acidity level in your esophagus.  A test thatmeasures how much pressure is on your esophagus.  A barium swallow or modified barium swallow to show the shape, size, and functioning of your esophagus.  How is this treated? The goal of treatment is to help relieve your symptoms and to prevent complications. Treatment for this condition may vary depending on how severe your symptoms are. Your health care provider may recommend:  Changes to your diet.  Medicine.  Surgery.  Follow these instructions at home: Diet  Follow a diet as recommended by your health care provider. This may involve avoiding foods and drinks such as: ? Coffee and tea (with or without caffeine). ? Drinks that containalcohol. ? Energy drinks and sports drinks. ? Carbonated drinks or sodas. ? Chocolate and cocoa. ? Peppermint and mint flavorings. ? Garlic and onions. ? Horseradish. ? Spicy and acidic foods, including peppers, chili powder, curry powder, vinegar, hot sauces, and barbecue sauce. ? Citrus fruit juices and citrus fruits, such as oranges, lemons, and limes. ? Tomato-based foods, such as red sauce, chili, salsa, and pizza with red sauce. ? Fried and fatty foods, such as donuts, french fries, potato chips, and high-fat dressings. ? High-fat meats, such as hot dogs and fatty cuts of red and white meats, such as rib eye steak, sausage, ham, and  bacon. ? High-fat dairy items, such as whole milk, butter, and cream cheese.  Eat small, frequent meals instead of large meals.  Avoid drinking large amounts of liquid with your meals.  Avoid eating meals during the 2-3 hours before bedtime.  Avoid lying down right after you eat.  Do not exercise right after you eat. General instructions  Pay attention to any changes in your symptoms.  Take over-the-counter and prescription medicines only as told by your health care provider. Do not take aspirin, ibuprofen, or other NSAIDs unless your health care provider told you to do so.  Do not use any tobacco products, including cigarettes, chewing tobacco, and e-cigarettes. If you need help quitting, ask your health care provider.  Wear loose-fitting clothing. Do not wear anything tight around your waist that causes pressure on your abdomen.  Raise (elevate) the head of your bed 6 inches (15cm).  Try to reduce your stress, such as with yoga or meditation. If you need help reducing stress, ask your health care provider.  If you are overweight, reduce your weight to an amount that is healthy for you. Ask your health care provider for guidance about a safe weight loss goal.  Keep all follow-up visits as told by your health care provider. This is important. Contact a health care provider if:  You have new symptoms.  You have unexplained weight loss.  You have difficulty swallowing, or it  hurts to swallow.  You have wheezing or a persistent cough.  Your symptoms do not improve with treatment.  You have a hoarse voice. Get help right away if:  You have pain in your arms, neck, jaw, teeth, or back.  You feel sweaty, dizzy, or light-headed.  You have chest pain or shortness of breath.  You vomit and your vomit looks like blood or coffee grounds.  You faint.  Your stool is bloody or black.  You cannot swallow, drink, or eat. This information is not intended to replace advice  given to you by your health care provider. Make sure you discuss any questions you have with your health care provider. Document Released: 12/27/2004 Document Revised: 08/17/2015 Document Reviewed: 07/14/2014 Elsevier Interactive Patient Education  2018 [place] for Gastroesophageal Reflux Disease, Adult When you have gastroesophageal reflux disease (GERD), the foods you eat and your eating habits are very important. Choosing the right foods can help ease your discomfort. What guidelines do I need to follow?  Choose fruits, vegetables, whole grains, and low-fat dairy products.  Choose low-fat meat, fish, and poultry.  Limit fats such as oils, salad dressings, butter, nuts, and avocado.  Keep a food diary. This helps you identify foods that cause symptoms.  Avoid foods that cause symptoms. These may be different for everyone.  Eat small meals often instead of 3 large meals a day.  Eat your meals slowly, in a place where you are relaxed.  Limit fried foods.  Cook foods using methods other than frying.  Avoid drinking alcohol.  Avoid drinking large amounts of liquids with your meals.  Avoid bending over or lying down until 2-3 hours after eating. What foods are not recommended? These are some foods and drinks that may make your symptoms worse: Vegetables Tomatoes. Tomato juice. Tomato and spaghetti sauce. Chili peppers. Onion and garlic. Horseradish. Fruits Oranges, grapefruit, and lemon (fruit and juice). Meats High-fat meats, fish, and poultry. This includes hot dogs, ribs, ham, sausage, salami, and bacon. Dairy Whole milk and chocolate milk. Sour cream. Cream. Butter. Ice cream. Cream cheese. Drinks Coffee and tea. Bubbly (carbonated) drinks or energy drinks. Condiments Hot sauce. Barbecue sauce. Sweets/Desserts Chocolate and cocoa. Donuts. Peppermint and spearmint. Fats and Oils High-fat foods. This includes [place] fries and potato  chips. Other Vinegar. Strong spices. This includes black pepper, white pepper, red pepper, cayenne, curry powder, cloves, ginger, and chili powder. The items listed above may not be a complete list of foods and drinks to avoid. Contact your dietitian for more information. This information is not intended to replace advice given to you by your health care provider. Make sure you discuss any questions you have with your health care provider. Document Released: 09/18/2011 Document Revised: 08/25/2015 Document Reviewed: 01/21/2013 Elsevier Interactive Patient Education  2017 [place] you for choosing [facility]  Kavitha Nandigam,MD

## 2017-11-22 NOTE — Progress Notes (Signed)
Michelle Romero    [id number]    11/16/1955  Primary Care Physician:Kuneff, Reinaldo Raddle, DO  Referring Physician: Ma Hillock, DO [address] [place], [place] [address]  Chief complaint:  GERD, regurgitation, constipation  HPI:  70 yr F here for new patient visit to discuss further management of GERD and related symptoms. She has constant drainage back of her throat, feels like a waterfall and also has to clear her throat often.  She uses sinus rinse on once daily 2 or 3 times.  She feels her symptoms have somewhat improved since she was started on omeprazole by Dr. Constance Holster (ENT specialist).  Previously she was having to do sinus saline rinse about 8-9 times a day.  She denies any trouble swallowing or pain on swallowing.  She has intermittent heartburn and also has regurgitation after every meal.  She also feels fluid coming up her throat when she lays down.  Oftentimes she wakes up with choking sensation and coughing.   She saw Dr. Constance Holster ENT specialist, on October 01, 2017, was started on Omeprazole 20 mg daily and she has been taking it for about 7 weeks ago now.  She also takes additional Zantac 150 mg 2-3 times daily. Denies any loss of appetite, abdominal pain, vomiting or weight loss No family history of GI malignancy.  Colonoscopy 01/25/2009 Normal colonoscopy   Outpatient Encounter Medications as of 11/22/2017  Medication Sig  . acetaminophen (TYLENOL) 500 MG tablet Take 500 mg by mouth every 6 (six) hours as needed.  . cholecalciferol (VITAMIN D) 1000 UNITS tablet Take 1,000 Units by mouth every other day.   . Cyanocobalamin (VITAMIN B 12) 250 MCG LOZG Take 1,000 mcg by mouth 4 (four) times daily.  Marland Kitchen guaiFENesin (MUCINEX) 600 MG 12 hr tablet Take by mouth 2 (two) times daily.  . hydrOXYzine (VISTARIL) 25 MG capsule Take 1-2 capsules (25-50 mg total) by mouth at bedtime.  Marland Kitchen omeprazole (PRILOSEC) 40 MG capsule Take 1 capsule (40 mg total) by mouth daily.  . ranitidine  (ZANTAC) 150 MG tablet Take 1 tablet (150 mg total) by mouth 2 (two) times daily.   No facility-administered encounter medications on file as of 11/22/2017.     Allergies as of 11/22/2017 - Review Complete 10/30/2017  Allergen Reaction Noted  . Codeine Other (See Comments) 01/08/2011  . Meloxicam  10/30/2011    Past Medical History:  Diagnosis Date  . Abdominal mass, right upper quadrant 01/30/2012  . Allergic state 01/30/2012  . 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 Chicken pox as a child  . DJD (degenerative joint disease) of hip 03/17/2012  . Dupuytren's contracture of hand 01/30/2012  . Elevated BP 11/30/2011  . GERD (gastroesophageal reflux disease)   . Hemorrhoids 12/28/2012  . History of blood transfusion   . Hypercalcemia   . Hyperlipidemia   . Measles as a child  . Osteopenia 05/2017   T score -1.5 FRAX 8.4% / 0.8%  . PONV (postoperative nausea and vomiting)   . Precancerous skin lesion 10/30/2011   Left abdomen, excised  . Vertigo 10/30/2011  . Vitamin D deficiency     Past Surgical History:  Procedure Laterality Date  . broke wrist  62 yrs old   plate placed by Dr Alphonzo Cruise at [facility]  . COLONOSCOPY  2010   [facility]  . SKIN LESION EXCISION     premelanoma on left abdominal wall  . 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 Stroke Mother   . Hypertension Mother   . Diabetes Mother        type 2  . Dementia Mother   . Arthritis Mother   . Cancer Father        melanoma- neck  . Arthritis Father   . Alzheimer's disease Father   . Fibromyalgia Sister   . Cancer Maternal Grandmother        pancreatic  . Heart attack Maternal Grandfather   . Cancer Paternal Grandmother        ?  Marland Kitchen Heart disease Paternal Grandfather        pacemaker  . Cancer Sister 19       ovarian  . Arthritis  Brother     Social History   Socioeconomic History  . Marital status: Widowed    Spouse name: Not on file  . Number of children: Not on file  . Years of education: 67  . Highest education level: Not on file  Occupational History  . Occupation: retired  Scientific laboratory technician  . Financial resource strain: Not on file  . Food insecurity:    Worry: Not on file    Inability: Not on file  . Transportation needs:    Medical: Not on file    Non-medical: Not on file  Tobacco Use  . Smoking status: Never Smoker  . Smokeless tobacco: Never Used  Substance and Sexual Activity  . Alcohol use: Yes    Alcohol/week: 1.0 standard drinks    Types: 1 Standard drinks or equivalent per week    Comment: rarely  . Drug use: No  . Sexual activity: Not Currently    Comment: 1st intercourse- 27, partner- 1, widow  Lifestyle  . Physical activity:    Days per week: Not on file    Minutes per session: Not on file  . Stress: Not on file  Relationships  . Social connections:    Talks on phone: Not on file    Gets together: Not on file    Attends religious service: Not on file    Active member of club or organization: Not on file    Attends meetings of clubs or organizations: Not on file    Relationship status: Not on file  . Intimate partner violence:    Fear of current or ex partner: Not on file    Emotionally abused: Not on file    Physically abused: Not on file    Forced sexual activity: Not on file  Other Topics Concern  . Not on file  Social History Narrative   Widowed. 2 children Kathee Delton and Gerrit Heck.   Masters degree. Retired.   Drinks caffeinated beverages. Uses herbal remedies.   Wears her seatbelt. Smoke detector in the home. Firearms in a locked cabinet in the home.   Exercises routinely.   Feels safe in her relationships.      Review of systems: Review of Systems  Constitutional: Negative for fever and chills.  HENT: Negative.   Eyes: Negative for blurred  vision.  Respiratory: Negative for cough, shortness of breath and wheezing.   Cardiovascular: Negative for chest pain and palpitations.  Gastrointestinal: as per HPI Genitourinary: Negative for dysuria, urgency, frequency and hematuria.  Musculoskeletal: Negative for myalgias,  back pain and joint pain.  Skin: Negative for itching and rash.  Neurological: Negative for dizziness, tremors, focal weakness, seizures and loss of consciousness.  Endo/Heme/Allergies: Positive for seasonal allergies.  Psychiatric/Behavioral: Negative for depression, suicidal ideas and hallucinations.  All other systems reviewed and are negative.   Physical Exam: Vitals:   11/22/17 1418  BP: 126/74  Pulse: 72   Body mass index is 26.74 kg/m. Gen:      No acute distress HEENT:  EOMI, sclera anicteric Neck:     No masses; no thyromegaly Lungs:    Clear to auscultation bilaterally; normal respiratory effort CV:         Regular rate and rhythm; no murmurs Abd:      + bowel sounds; soft, non-tender; no palpable masses, no distension Ext:    No edema; adequate peripheral perfusion Skin:      Warm and dry; no rash Neuro: alert and oriented x 3 Psych: normal mood and affect  Data Reviewed:  Reviewed labs, radiology imaging, old records and pertinent past GI work up  CT abdomen pelvis February 01, 2012 1.  No acute inflammatory process within abdomen or pelvis.  No abdominal mass is identified. 2.  Moderate stool throughout the colon. 3.  Probable small angiolipomas or lipomas left kidney.  No hydronephrosis or hydroureter. 4.  Normal appendix.  No pericecal inflammation. 5.  Probable myometrial fibroid left uterine body measures 3.2 x 2.6 cm.  Correlation with GYN exam and further evaluation with pelvic ultrasound is recommended for confirmation.  Assessment and Plan/Recommendations:  62 year old female with complaints of regurgitation, globus sensation, sore throat and intermittent heartburn.  No  dysphagia, odynophagia or weight loss. Symptoms improved to some degree with omeprazole 20 mg daily Will switch to Protonix 40 mg daily, advised patient to take it 30 minutes before breakfast Ranitidine 150 mg at bedtime  Discussed antireflux measures and lifestyle modifications in detail Use Gaviscon 1 tablet up to 3 times daily as needed after meals for breakthrough symptoms She likely has additional component of seasonal allergies/sinus drainage, advised patient to restart taking Allegra 1 tablet daily Also advised patient to avoid doing frequent saline sinus rinses as that could also lead to irritation and sinus drainage Intermittent constipation: Benefiber 1 teaspoon 3 times daily with meals Trial of probiotic 1 capsule daily, advised patient to use it for 4 to 6 weeks  Greater than 50% of the time used for counseling as well as treatment plan and follow-up. She had multiple questions which were answered to her satisfaction  K. Denzil Magnuson , MD [phone]    CC: Raoul Pitch, Renee A, DO

## 2017-11-29 ENCOUNTER — Ambulatory Visit (HOSPITAL_COMMUNITY)
Admission: RE | Admit: 2017-11-29 | Discharge: 2017-11-29 | Disposition: A | Discharge status: Home / Self Care | Payer: Self-pay | Source: Ambulatory Visit | Attending: Gastroenterology | Admitting: Gastroenterology

## 2017-11-29 DIAGNOSIS — K219 Gastro-esophageal reflux disease without esophagitis: Secondary | ICD-10-CM | POA: Insufficient documentation

## 2017-11-29 DIAGNOSIS — R1110 Vomiting, unspecified: Secondary | ICD-10-CM | POA: Insufficient documentation

## 2017-12-09 ENCOUNTER — Telehealth: Payer: Self-pay | Admitting: Gastroenterology

## 2017-12-09 NOTE — Telephone Encounter (Signed)
Patient wants to move the Protonix to a different time of day. Take it in the afternoon instead of the morning. The reason is because she feels she may be having night time indigestion. She is taking a Benadryl at bedtime to "dry up the secretions." Reviewed the need to take the Protonix on an empty stomach. She expresses understanding. She will call with follow up in a week.

## 2017-12-09 NOTE — Telephone Encounter (Signed)
Patient will call with follow up no sooner than 1 week.

## 2017-12-09 NOTE — Telephone Encounter (Signed)
Ok to switch Protonix to before dinner.

## 2017-12-19 NOTE — Telephone Encounter (Signed)
Left a message for the patient.

## 2017-12-19 NOTE — Telephone Encounter (Signed)
Pt is returning your call [phone]

## 2017-12-19 NOTE — Telephone Encounter (Signed)
Patient calling to follow up with nurse [facility]

## 2017-12-20 ENCOUNTER — Other Ambulatory Visit: Payer: Self-pay

## 2017-12-20 DIAGNOSIS — K219 Gastro-esophageal reflux disease without esophagitis: Secondary | ICD-10-CM

## 2017-12-20 MED ORDER — FAMOTIDINE 40 MG PO TABS: 40.0000 mg | ORAL_TABLET | Freq: Every day | ORAL | 6 refills | Status: DC

## 2017-12-20 NOTE — Telephone Encounter (Signed)
Please advise patient to switch to Pepcid 40mg  daily at bedtime and continue Protonix in the AM.. Phlegm could be secondary to post nasal drip.

## 2017-12-20 NOTE — Telephone Encounter (Signed)
Spoke with the patient. She did not find any improvement with changing the time of day she takes her medications. Symptoms are "phlegm at the base of my throat and a feeling that something is there." Phlegm is heaviest first thing in the morning. She is also taking Benadryl at bedtime. She sleeps on 2 pillows. Following reflux precautions.Takes Protonix and ranitidine.

## 2018-01-03 ENCOUNTER — Ambulatory Visit (HOSPITAL_BASED_OUTPATIENT_CLINIC_OR_DEPARTMENT_OTHER)
Admission: RE | Admit: 2018-01-03 | Discharge: 2018-01-03 | Disposition: A | Discharge status: Home / Self Care | Payer: Self-pay | Source: Ambulatory Visit | Attending: Family Medicine | Admitting: Family Medicine

## 2018-01-03 ENCOUNTER — Telehealth: Payer: Self-pay | Admitting: Family Medicine

## 2018-01-03 ENCOUNTER — Ambulatory Visit (INDEPENDENT_AMBULATORY_CARE_PROVIDER_SITE_OTHER): Payer: Self-pay | Admitting: Family Medicine

## 2018-01-03 ENCOUNTER — Encounter: Payer: Self-pay | Admitting: Family Medicine

## 2018-01-03 VITALS — BP 137/81 | HR 66 | Temp 98.2°F | Resp 20 | Ht 69.75 in | Wt 178.0 lb

## 2018-01-03 DIAGNOSIS — Z23 Encounter for immunization: Secondary | ICD-10-CM

## 2018-01-03 DIAGNOSIS — S6991XA Unspecified injury of right wrist, hand and finger(s), initial encounter: Secondary | ICD-10-CM

## 2018-01-03 DIAGNOSIS — S60512A Abrasion of left hand, initial encounter: Secondary | ICD-10-CM

## 2018-01-03 DIAGNOSIS — S52501A Unspecified fracture of the lower end of right radius, initial encounter for closed fracture: Secondary | ICD-10-CM | POA: Insufficient documentation

## 2018-01-03 DIAGNOSIS — X58XXXA Exposure to other specified factors, initial encounter: Secondary | ICD-10-CM | POA: Insufficient documentation

## 2018-01-03 NOTE — Patient Instructions (Signed)
Wear wrist splint for 2 weeks. Use NSAIDS and ice as needed.  Please have xray completed at [facility]. We will call you with results once available (probably Monday).   Wrist Sprain, Adult A wrist sprain is a stretch or tear in the strong, fibrous tissues (ligaments) that connect your wrist bones. There are three types of wrist sprains:  Grade 1. In this type of sprain, the ligament is stretched more than normal.  Grade 2. In this type of sprain, the ligament is partially torn. You may be able to move your wrist, but not very much.  Grade 3. In this type of sprain, the ligament or muscle is completely torn. You may find it difficult or extremely painful to move your wrist even a little.  What are the causes? A wrist sprain can be caused by using the wrist too much during sports, exercise, or at work. It can also happen with a fall or during an accident. What increases the risk? This condition is more likely to occur in people:  With a previous wrist or arm injury.  With poor wrist strength and flexibility.  Who play contact sports, such as football or soccer.  Who play sports that may result in a fall, such as skateboarding, biking, skiing, or snowboarding.  Who do not exercise regularly.  Who use exercise equipment that does not fit well.  What are the signs or symptoms? Symptoms of this condition include:  Pain in the wrist, arm, or hand.  Swelling or bruised skin near the wrist, hand, or arm. The skin may look yellow or kind of blue.  Stiffness or trouble moving the hand.  Hearing a pop or feeling a tear at the time of the injury.  A warm feeling in the skin around the wrist.  How is this diagnosed? This condition is diagnosed with a physical exam. Sometimes an X-ray is taken to make sure a bone did not break. If your health care provider thinks that you tore a ligament, he or she may order an MRI of your wrist. How is this treated? This condition is treated  by resting and applying ice to your wrist. Additional treatment may include:  Medicine for pain and inflammation.  A splint to keep your wrist still (immobilized).  Exercises to strengthen and stretch your wrist.  Surgery. This may be done if the ligament is completely torn.  Follow these instructions at home: If you have a splint:   Do not put pressure on any part of the splint until it is fully hardened. This may take several hours.  Wear the splint as told by your health care provider. Remove it only as told by your health care provider.  Loosen the splint if your fingers tingle, become numb, or turn cold and blue.  If your splint is not waterproof: ? Do not let it get wet. ? Cover it with a watertight covering when you take a bath or a shower.  Keep the splint clean. Managing pain, stiffness, and swelling   If directed, put ice on the injured area. ? If you have a removable splint, remove it as told by your health care provider. ? Put ice in a plastic bag. ? Place a towel between your skin and the bag or between the splint and the bag. ? Leave the ice on for 20 minutes, 2-3 times per day.  Move your fingers often to avoid stiffness and to lessen swelling.  Raise (elevate) the injured area above the  level of your heart while you are sitting or lying down. Activity  Rest your wrist. Do not do things that cause pain.  Return to your normal activities as told by your health care provider. Ask your health care provider what activities are safe for you.  Do exercises as told by your health care provider. General instructions  Take over-the-counter and prescription medicines only as told by your health care provider.  Do not use any products that contain nicotine or tobacco, such as cigarettes and e-cigarettes. These can delay healing. If you need help quitting, ask your health care provider.  Ask your health care provider when it is safe to drive if you have a  splint.  Keep all follow-up visits as told by your health care provider. This is important. Contact a health care provider if:  Your pain, bruising, or swelling gets worse.  Your skin becomes red, gets a rash, or has open sores.  Your pain does not get better or it gets worse. Get help right away if:  You have a new or sudden sharp pain in the hand, arm, or wrist.  You have tingling or numbness in your hand.  Your fingers turn white, very red, or cold and blue.  You cannot move your fingers. This information is not intended to replace advice given to you by your health care provider. Make sure you discuss any questions you have with your health care provider. Document Released: 11/20/2013 Document Revised: 10/15/2015 Document Reviewed: 10/06/2015 Elsevier Interactive Patient Education  [employer].

## 2018-01-03 NOTE — Progress Notes (Signed)
Michelle Romero , 1955-08-01, 62 y.o., female MRN: [id number] Patient Care Team    Relationship Specialty Notifications Start End  Ma Hillock, DO PCP - General Family Medicine  12/09/15   Princess Bruins, MD Consulting Physician Obstetrics and Gynecology  12/09/15     Chief Complaint  Patient presents with  . Hand Injury    right hand swollen,left hand with abrasions     Subjective: Pt presents for an OV with complaints of right wrist pain of 1 day duration after she fell chasing her dog that pulled away from her. She states she fell in her drive way and scratched up her left palm. Her right wrist did swell but did not become painful until today. She reports she is able to move her wrist and hand ok, it can just be uncomfortable. She does have osteopenia. Her tetanus is out of date. She took an nsaid for discomfort once, but has not felt like she needed to take any yet today.   Depression screen PHQ 2/9 03/15/2017  Decreased Interest 0  Down, Depressed, Hopeless 0  PHQ - 2 Score 0    Allergies  Allergen Reactions  . Codeine Other (See Comments)    Fainting  . Mobic [Meloxicam]     Vertigo, dizziness, sweating, chest tightness, diarrhea, dehydrated from being outside   Social History   Tobacco Use  . Smoking status: Never Smoker  . Smokeless tobacco: Never Used  Substance Use Topics  . Alcohol use: Not Currently    Alcohol/week: 1.0 standard drinks    Types: 1 Standard drinks or equivalent per week   Past Medical History:  Diagnosis Date  . Abdominal mass, right upper quadrant 01/30/2012  . Allergic state 01/30/2012  . 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 Chicken pox as a child  . DJD (degenerative joint disease) of hip 03/17/2012  . Dupuytren's contracture of hand 01/30/2012  . Elevated BP 11/30/2011  . GERD (gastroesophageal reflux disease)   . Hemorrhoids 12/28/2012  . History of blood transfusion   . Hypercalcemia   . Hyperlipidemia   . Measles as a child  .  Osteopenia 05/2017   T score -1.5 FRAX 8.4% / 0.8%  . PONV (postoperative nausea and vomiting)   . Precancerous skin lesion 10/30/2011   Left abdomen, excised  . Vertigo 10/30/2011  . Vitamin D deficiency    Past Surgical History:  Procedure Laterality Date  . broke wrist Left 62 yrs old   plate placed by Dr Alphonzo Cruise at [facility]  . COLONOSCOPY  2010   [facility]  . SKIN LESION EXCISION     premelanoma on left abdominal wall  . TONSILLECTOMY    . TOTAL HIP ARTHROPLASTY  04/16/2012   Procedure: TOTAL HIP ARTHROPLASTY ANTERIOR APPROACH;  Surgeon: Gearlean Alf, MD;  Location: [facility];  Service: Orthopedics;  Laterality: Right;   Family History  Problem Relation Age of Onset  . Stroke Mother   . Hypertension Mother   . Diabetes Mother        type 2  . Dementia Mother   . Arthritis Mother   . Arthritis Father   . Alzheimer's disease Father   . Melanoma Father        neck  . Parkinson's disease Father   . Fibromyalgia Sister   . Pancreatic cancer Maternal Grandmother   . Heart attack Maternal Grandfather   . Heart disease Maternal Grandfather   . Cancer Paternal Grandmother        ?  Marland Kitchen  Heart disease Paternal Grandfather        pacemaker  . Ovarian cancer Sister 53  . Fibromyalgia Sister   . Arthritis Brother    Allergies as of 01/03/2018      Reactions   Codeine Other (See Comments)   Fainting   Mobic [meloxicam]    Vertigo, dizziness, sweating, chest tightness, diarrhea, dehydrated from being outside      Medication List        Accurate as of 01/03/18 12:27 PM. Always use your most recent med list.          cholecalciferol 1000 units tablet Commonly known as:  VITAMIN D Take 1,000 Units by mouth every other day.   diphenhydrAMINE 25 mg capsule Commonly known as:  BENADRYL Take 25 mg by mouth at bedtime.   GAVISCON PO Take by mouth.   pantoprazole 40 MG tablet Commonly known as:  PROTONIX Take 1 tablet (40 mg total) by mouth daily. 30 minutes before  breakfast   ranitidine 75 MG tablet Commonly known as:  ZANTAC Take 150 mg by mouth 2 (two) times daily.   Vitamin B 12 250 MCG Lozg Take 1,000 mcg by mouth 4 (four) times daily.       All past medical history, surgical history, allergies, family history, immunizations andmedications were updated in the EMR today and reviewed under the history and medication portions of their EMR.     ROS: Negative, with the exception of above mentioned in HPI   Objective:  BP 137/81 (BP Location: Right Arm, Patient Position: Sitting, Cuff Size: Normal)   Pulse 66   Temp 98.2 F (36.8 C)   Resp 20   Ht 5' 9.75" (1.772 m)   Wt 178 lb (80.7 kg)   SpO2 96%   BMI 25.72 kg/m  Body mass index is 25.72 kg/m. Gen: Afebrile. No acute distress. Nontoxic in appearance, well developed, well nourished.  HENT: AT. [place]. MMM Eyes:Pupils Equal Round Reactive to light, Extraocular movements intact,  Conjunctiva without redness, discharge or icterus. MSK/skin: left hand with abrasions of finger and palm, no FB identified. Right wrist with dorsal swelling, no erythema. TTP radial aspect ventral wrist and over ulnar styloid. FROM of wrist with mild discomfort in dorsal flexion and resisted supination. No tenderness ulnar or radius proximally. NV intact distally.   Neuro:  Normal gait. PERLA. EOMi. Alert. Oriented x3   No exam data present No results found. No results found for this or any previous visit (from the past 24 hour(s)).  Assessment/Plan: Michelle Romero is a 62 y.o. female present for OV for  Immunization due Agreeable to flu shot today. Td updated > 10 years with skin break after fall.  - Flu Vaccine QUAD 6+ mos PF IM (Fluarix Quad PF) - Td : Tetanus/diphtheria >7yo Preservative  free  Injury of right wrist, initial encounter/Abrasion of left hand, initial encounter - no signs of FB or infection of abrasions. Encouraged her to cleanse well with peroxide daily and keep clean, covered, abx  ointment daily.  - right wrist with 2 area of focal TTP concerning for potential fracture. Wrist is swollen dorsally--> rest, wrist splint provided to wear during waking hours, nsaids if tolerated, ICE if needed. Xray.  - DG Wrist Complete Right; Future - f/u 2 weeks if xray is normal. If xray abnl will refer to ortho.   Reviewed expectations re: course of current medical issues.  Discussed self-management of symptoms.  Outlined signs and symptoms indicating need for  more acute intervention.  Patient verbalized understanding and all questions were answered.  Patient received an After-Visit Summary.    Orders Placed This Encounter  Procedures  . DG Wrist Complete Right  . Flu Vaccine QUAD 6+ mos PF IM (Fluarix Quad PF)  . Td : Tetanus/diphtheria >7yo Preservative  free     Note is dictated utilizing voice recognition software. Although note has been proof read prior to signing, occasional typographical errors still can be missed. If any questions arise, please do not hesitate to call for verification.   electronically signed by:  Howard Pouch, DO  [place]

## 2018-01-03 NOTE — Telephone Encounter (Signed)
Please inform patient the following information: Her xray showed a possible nondisplaced fracture of her radius. It is very small area that they are concerned over.  Given these findings, I recommend she wear the wrist splint 24 hours and be seen ASAP at ortho clinic.  I have placed a referral for her to get her. If her pain or swelling worsens over the weekend-or before she is able to see ortho, she should be seen in one of the orthopedic after hours clinics or an urgent care.   She does not need to followup here as well for this since she is being referred to ortho.

## 2018-01-03 NOTE — Telephone Encounter (Signed)
Spoke with patient reviewed xray results ,instructions and information. Patient verbalized understanding. 

## 2018-01-07 DIAGNOSIS — S52509A Unspecified fracture of the lower end of unspecified radius, initial encounter for closed fracture: Secondary | ICD-10-CM

## 2018-01-07 HISTORY — DX: Unspecified fracture of the lower end of unspecified radius, initial encounter for closed fracture: S52.509A

## 2018-03-11 ENCOUNTER — Other Ambulatory Visit: Payer: Self-pay | Admitting: Gastroenterology

## 2018-03-11 NOTE — Telephone Encounter (Signed)
Patient states she needs medication pantoprazole refilled at [facility]. Patient states she is still having the acid reflux but does not know what else to do. Pt last seen 8.23.19.

## 2018-03-11 NOTE — Telephone Encounter (Signed)
[facility] But it seems protonix is not helping  Dr Silverio Decamp please advise

## 2018-03-13 ENCOUNTER — Telehealth: Payer: Self-pay | Admitting: *Deleted

## 2018-03-13 DIAGNOSIS — N838 Other noninflammatory disorders of ovary, fallopian tube and broad ligament: Secondary | ICD-10-CM

## 2018-03-13 NOTE — Telephone Encounter (Signed)
Per note on 07/10/17 " Given that the ovaries were not visualized on today's exam, the decision was made to proceed with a pelvic ultrasound at the radiology center for next Pelvic US with Annual/Gyn visit."  Order placed at [employer] patient will call to schedule.

## 2018-03-14 MED ORDER — PANTOPRAZOLE SODIUM 40 MG PO TBEC: 40.0000 mg | DELAYED_RELEASE_TABLET | Freq: Every day | ORAL | 3 refills | Status: DC

## 2018-03-14 NOTE — Telephone Encounter (Signed)
Please advise patient to follow-up with ENT.  Globus sensation likely secondary to postnasal drip and sinus problem Barium esophagram did not show significant gastroesophageal reflux or any esophageal abnormality. We can discontinue Protonix given its not helping with her symptoms.

## 2018-03-14 NOTE — Telephone Encounter (Signed)
, °

## 2018-03-17 NOTE — Telephone Encounter (Signed)
Informed patient of recommendations and she is working on changing her diet and has seen some improvements and will continue the protonix

## 2018-03-18 NOTE — Telephone Encounter (Signed)
Appointment on 04/04/18 @ 2:30pm

## 2018-04-04 ENCOUNTER — Ambulatory Visit (HOSPITAL_COMMUNITY)
Admission: RE | Admit: 2018-04-04 | Discharge: 2018-04-04 | Disposition: A | Discharge status: Home / Self Care | Payer: Self-pay | Source: Ambulatory Visit | Attending: Obstetrics & Gynecology | Admitting: Obstetrics & Gynecology

## 2018-04-04 DIAGNOSIS — N838 Other noninflammatory disorders of ovary, fallopian tube and broad ligament: Secondary | ICD-10-CM | POA: Insufficient documentation

## 2018-05-06 ENCOUNTER — Encounter: Payer: Self-pay | Admitting: Obstetrics & Gynecology

## 2018-05-06 ENCOUNTER — Ambulatory Visit (INDEPENDENT_AMBULATORY_CARE_PROVIDER_SITE_OTHER): Payer: Self-pay | Admitting: Obstetrics & Gynecology

## 2018-05-06 VITALS — BP 150/88 | Ht 70.0 in | Wt 164.4 lb

## 2018-05-06 DIAGNOSIS — M8589 Other specified disorders of bone density and structure, multiple sites: Secondary | ICD-10-CM

## 2018-05-06 DIAGNOSIS — Z01419 Encounter for gynecological examination (general) (routine) without abnormal findings: Secondary | ICD-10-CM

## 2018-05-06 DIAGNOSIS — Z8041 Family history of malignant neoplasm of ovary: Secondary | ICD-10-CM

## 2018-05-06 DIAGNOSIS — Z780 Asymptomatic menopausal state: Secondary | ICD-10-CM

## 2018-05-06 NOTE — Progress Notes (Signed)
KWANA RINGEL 1955/09/17 [id number]   History:    63 y.o. G2P2L2 Married  RP:  Established patient presenting for annual gyn exam   HPI: Menopause, well on no hormone replacement therapy.  No postmenopausal bleeding.  No pelvic pain.  Abstinent.  Breast normal.  Weight loss witn Weight Watcher.  Weightlifting and swimming.  Healthy nutrition.  Current body mass index 23.59.  Health labs with family physician.  Past medical history,surgical history, family history and social history were all reviewed and documented in the EPIC chart.  Gynecologic History No LMP recorded. Patient is postmenopausal. Contraception: abstinence and post menopausal status Last Pap: Normal in the past Last mammogram: 02/2016. Results were: normal Bone Density: 05/2017  Osteopenia at the Spine and Rt Forearm Colonoscopy: 2011 10 yr schedule  Obstetric History OB History  Gravida Para Term Preterm AB Living  2 2       2   SAB TAB Ectopic Multiple Live Births               # Outcome Date GA Lbr Len/2nd Weight Sex Delivery Anes PTL Lv  2 Para           1 Para              ROS: A ROS was performed and pertinent positives and negatives are included in the history.  GENERAL: No fevers or chills. HEENT: No change in vision, no earache, sore throat or sinus congestion. NECK: No pain or stiffness. CARDIOVASCULAR: No chest pain or pressure. No palpitations. PULMONARY: No shortness of breath, cough or wheeze. GASTROINTESTINAL: No abdominal pain, nausea, vomiting or diarrhea, melena or bright red blood per rectum. GENITOURINARY: No urinary frequency, urgency, hesitancy or dysuria. MUSCULOSKELETAL: No joint or muscle pain, no back pain, no recent trauma. DERMATOLOGIC: No rash, no itching, no lesions. ENDOCRINE: No polyuria, polydipsia, no heat or cold intolerance. No recent change in weight. HEMATOLOGICAL: No anemia or easy bruising or bleeding. NEUROLOGIC: No headache, seizures, numbness, tingling or weakness.  PSYCHIATRIC: No depression, no loss of interest in normal activity or change in sleep pattern.     Exam:   BP (!) 150/88   Ht 5\' 10"  (1.778 m)   Wt 164 lb 6.4 oz (74.6 kg)   BMI 23.59 kg/m   Body mass index is 23.59 kg/m.  General appearance : Well developed well nourished female. No acute distress HEENT: Eyes: no retinal hemorrhage or exudates,  Neck supple, trachea midline, no carotid bruits, no thyroidmegaly Lungs: Clear to auscultation, no rhonchi or wheezes, or rib retractions  Heart: Regular rate and rhythm, no murmurs or gallops Breast:Examined in sitting and supine position were symmetrical in appearance, no palpable masses or tenderness,  no skin retraction, no nipple inversion, no nipple discharge, no skin discoloration, no axillary or supraclavicular lymphadenopathy Abdomen: no palpable masses or tenderness, no rebound or guarding Extremities: no edema or skin discoloration or tenderness  Pelvic: Vulva: Normal             Vagina: No gross lesions or discharge  Cervix: No gross lesions or discharge.  Pap reflex done.  Uterus  AV, normal size, shape and consistency, non-tender and mobile  Adnexa  Without masses or tenderness  Anus: Normal  Pelvic US 04/04/2018: Uterus: Measurements: 4.2 x 2.6 x 3.7 cm = volume: 20.9 mL. Normal morphology. 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 Endometrium: Thickness: 3 mm thick.  No endometrial fluid or focal abnormality. Right ovary: Measurements: 2.5 x 1.2 x 1.8 cm = volume: 2.8 mL. Normal morphology without mass. Single tiny microcalcification, nonspecific. Internal blood flow present on color Doppler imaging. Left ovary: Measurements: 2.0 x 2.0 x 1.7 cm = volume: 1.8 mL. Normal morphology without mass. Single tiny nonspecific microcalcification.  No free pelvic fluid.  No adnexal masses.    Assessment/Plan:  63 y.o. female for annual exam    1. Encounter for routine gynecological examination with Papanicolaou smear of cervix Normal gynecologic exam in menopause.  Pap reflex done.  Breast exam normal.  Will schedule screening mammogram now.  Last colonoscopy in 2011, will schedule repeat colonoscopy in 2021.  Health labs with family physician.  2. Postmenopausal Well on no hormone replacement therapy.  No postmenopausal bleeding.  Abstinent.  3. Osteopenia of multiple sites Bone density in March 2019 showed osteopenia at the spine and the right forearm.  Vitamin D supplements, calcium intake of 1200 to 1500 mg daily, continue with weightbearing physical activities regularly.  We will repeat a bone density in March 2021.  4. Family history of ovarian carcinoma Sister with ovarian cancer.  Repeat pelvic ultrasound April 04, 2018 showed bilateral normal ovaries with no pelvic fluid.  Patient reassured.  Princess Bruins MD, 11:15 AM 05/06/2018

## 2018-05-07 ENCOUNTER — Encounter: Payer: Self-pay | Admitting: Obstetrics & Gynecology

## 2018-05-07 LAB — PAP IG W/ RFLX HPV ASCU

## 2018-05-07 NOTE — Patient Instructions (Signed)
1. Encounter for routine gynecological examination with Papanicolaou smear of cervix Normal gynecologic exam in menopause.  Pap reflex done.  Breast exam normal.  Will schedule screening mammogram now.  Last colonoscopy in 2011, will schedule repeat colonoscopy in 2021.  Health labs with family physician.  2. Postmenopausal Well on no hormone replacement therapy.  No postmenopausal bleeding.  Abstinent.  3. Osteopenia of multiple sites Bone density in March 2019 showed osteopenia at the spine and the right forearm.  Vitamin D supplements, calcium intake of 1200 to 1500 mg daily, continue with weightbearing physical activities regularly.  We will repeat a bone density in March 2021.  4. Family history of ovarian carcinoma Sister with ovarian cancer.  Repeat pelvic ultrasound April 04, 2018 showed bilateral normal ovaries with no pelvic fluid.  Patient reassured.  Michelle Romero, it was a pleasure seeing you today!  I will inform you of your results as soon as they are available.

## 2018-05-30 ENCOUNTER — Encounter: Payer: Self-pay | Admitting: Obstetrics & Gynecology

## 2018-07-02 ENCOUNTER — Telehealth: Payer: Self-pay | Admitting: Gastroenterology

## 2018-07-02 MED ORDER — PANTOPRAZOLE SODIUM 40 MG PO TBEC: 40.0000 mg | DELAYED_RELEASE_TABLET | Freq: Every day | ORAL | 3 refills | Status: DC

## 2018-07-02 NOTE — Telephone Encounter (Signed)
Protonix sent to pharmacy

## 2018-07-02 NOTE — Telephone Encounter (Signed)
Pt is needing a refill

## 2018-07-17 ENCOUNTER — Telehealth: Payer: Self-pay | Admitting: Gastroenterology

## 2018-07-17 MED ORDER — PANTOPRAZOLE SODIUM 40 MG PO TBEC: 40.0000 mg | DELAYED_RELEASE_TABLET | Freq: Every day | ORAL | 3 refills | Status: DC

## 2018-07-17 NOTE — Telephone Encounter (Signed)
[facility]  This was sent in on 07/02/2018 as well

## 2018-10-10 ENCOUNTER — Telehealth: Payer: Self-pay | Admitting: Family Medicine

## 2018-10-10 DIAGNOSIS — K118 Other diseases of salivary glands: Secondary | ICD-10-CM

## 2018-10-10 HISTORY — DX: Other diseases of salivary glands: K11.8

## 2018-10-10 NOTE — Telephone Encounter (Signed)
FYI Patient is has a lump under her right ear at her jaw line. Patient is going out of town today so wanted in office visit. No appointments available with PCP. Offered patient to see a different provider. Patient asked if it was okay to go to [facility] since she is an established patient at their office. Patient will call to make an appointment. Advised patient to call us back if she is unable to get an appt.

## 2018-10-30 DIAGNOSIS — M79604 Pain in right leg: Secondary | ICD-10-CM | POA: Insufficient documentation

## 2018-12-17 ENCOUNTER — Encounter: Payer: Self-pay | Admitting: Gastroenterology

## 2018-12-30 ENCOUNTER — Encounter: Payer: Self-pay | Admitting: Gynecology

## 2019-01-14 ENCOUNTER — Ambulatory Visit (INDEPENDENT_AMBULATORY_CARE_PROVIDER_SITE_OTHER): Payer: Self-pay

## 2019-01-14 ENCOUNTER — Other Ambulatory Visit: Payer: Self-pay

## 2019-01-14 DIAGNOSIS — Z23 Encounter for immunization: Secondary | ICD-10-CM

## 2019-06-29 ENCOUNTER — Telehealth: Payer: Self-pay | Admitting: Gastroenterology

## 2019-06-29 NOTE — Telephone Encounter (Signed)
Patient called states she has done two colon procedure and they have came out good with no bx needed so she is wondering why she is not a candidate for a virtual colon

## 2019-06-29 NOTE — Telephone Encounter (Signed)
I will call the patient.  Please advise on the recommended colon cancer screening for her. Thanks

## 2019-07-06 NOTE — Telephone Encounter (Signed)
Patient will reach out to [place] next month. She will have a change of insurance at that time.

## 2019-07-06 NOTE — Telephone Encounter (Signed)
Insurance may not approve virtual CT colonoscopy and is inferior to colonoscopy, she will still need to do the bowel prep.  She can do cologaurd for average risk screening if she doesn't want to undergo colonoscopy.

## 2019-09-03 ENCOUNTER — Telehealth: Payer: Self-pay | Admitting: Gastroenterology

## 2019-09-04 NOTE — Telephone Encounter (Signed)
Spoke with the patient. She has Medicare insurance now and would like to go forward with colon cancer screening using Cologuard. She states she has researched the test and feels comfortable with it. Form faxed to [employer].

## 2019-09-25 LAB — EXTERNAL GENERIC LAB PROCEDURE: COLOGUARD: NEGATIVE

## 2019-10-01 ENCOUNTER — Other Ambulatory Visit: Payer: Self-pay

## 2019-10-01 LAB — COLOGUARD: Cologuard: NEGATIVE

## 2019-10-26 ENCOUNTER — Telehealth: Payer: Self-pay | Admitting: Gastroenterology

## 2019-10-26 NOTE — Telephone Encounter (Signed)
Dr Silverio Decamp patient is wanting a refill for pantoprazole but has not been seen in the office since 2019   Is it ok to refill?

## 2019-10-26 NOTE — Telephone Encounter (Signed)
Ok to send refill, please schedule office f/u visit next available non urgent. Thanks

## 2019-10-27 MED ORDER — PANTOPRAZOLE SODIUM 40 MG PO TBEC: 40.0000 mg | DELAYED_RELEASE_TABLET | Freq: Every day | ORAL | 3 refills | Status: DC

## 2019-10-27 NOTE — Telephone Encounter (Signed)
Sent refills of Protonix, No appointments available for patient to schedule follow up at this time

## 2019-10-30 NOTE — Telephone Encounter (Signed)
Pharmacy updated to [facility] in [place]

## 2020-03-01 LAB — HM MAMMOGRAPHY

## 2020-10-20 ENCOUNTER — Other Ambulatory Visit: Payer: Self-pay | Admitting: Obstetrics and Gynecology

## 2020-10-20 DIAGNOSIS — M8580 Other specified disorders of bone density and structure, unspecified site: Secondary | ICD-10-CM

## 2020-12-17 ENCOUNTER — Other Ambulatory Visit: Payer: Self-pay | Admitting: Gastroenterology

## 2020-12-21 ENCOUNTER — Other Ambulatory Visit: Payer: Self-pay

## 2020-12-21 ENCOUNTER — Ambulatory Visit (INDEPENDENT_AMBULATORY_CARE_PROVIDER_SITE_OTHER): Payer: Medicare Other | Admitting: Family Medicine

## 2020-12-21 ENCOUNTER — Encounter: Payer: Self-pay | Admitting: Family Medicine

## 2020-12-21 VITALS — BP 148/72 | HR 64 | Temp 97.8°F | Resp 18 | Ht 69.69 in | Wt 172.6 lb

## 2020-12-21 DIAGNOSIS — Z5181 Encounter for therapeutic drug level monitoring: Secondary | ICD-10-CM

## 2020-12-21 DIAGNOSIS — M949 Disorder of cartilage, unspecified: Secondary | ICD-10-CM

## 2020-12-21 DIAGNOSIS — Z1159 Encounter for screening for other viral diseases: Secondary | ICD-10-CM

## 2020-12-21 DIAGNOSIS — M899 Disorder of bone, unspecified: Secondary | ICD-10-CM

## 2020-12-21 DIAGNOSIS — E782 Mixed hyperlipidemia: Secondary | ICD-10-CM

## 2020-12-21 DIAGNOSIS — Z79899 Other long term (current) drug therapy: Secondary | ICD-10-CM

## 2020-12-21 DIAGNOSIS — R7309 Other abnormal glucose: Secondary | ICD-10-CM | POA: Diagnosis not present

## 2020-12-21 DIAGNOSIS — Z0000 Encounter for general adult medical examination without abnormal findings: Secondary | ICD-10-CM | POA: Diagnosis not present

## 2020-12-21 DIAGNOSIS — I10 Essential (primary) hypertension: Secondary | ICD-10-CM | POA: Insufficient documentation

## 2020-12-21 DIAGNOSIS — Z23 Encounter for immunization: Secondary | ICD-10-CM

## 2020-12-21 MED ORDER — AMLODIPINE BESYLATE 2.5 MG PO TABS: 2.5000 mg | ORAL_TABLET | Freq: Every day | ORAL | 1 refills | Status: DC

## 2020-12-21 NOTE — Patient Instructions (Addendum)
Great to see you today.  I have refilled the medication(s) we provide.   If labs were collected, we will inform you of lab results once received either by echart message or telephone call.   - echart message- for normal results that have been seen by the patient already.   - telephone call: abnormal results or if patient has not viewed results in their echart.  Start amlodipine daily for blood pressure . Follow up in 2-3 months on blood pressure.   Health Maintenance After Age 13 After age 40, you are at a higher risk for certain long-term diseases and infections as well as injuries from falls. Falls are a major cause of broken bones and head injuries in people who are older than age 30. Getting regular preventive care can help to keep you healthy and well. Preventive care includes getting regular testing and making lifestyle changes as recommended by your health care provider. Talk with your health care provider about: Which screenings and tests you should have. A screening is a test that checks for a disease when you have no symptoms. A diet and exercise plan that is right for you. What should I know about screenings and tests to prevent falls? Screening and testing are the best ways to find a health problem early. Early diagnosis and treatment give you the best chance of managing medical conditions that are common after age 34. Certain conditions and lifestyle choices may make you more likely to have a fall. Your health care provider may recommend: Regular vision checks. Poor vision and conditions such as cataracts can make you more likely to have a fall. If you wear glasses, make sure to get your prescription updated if your vision changes. Medicine review. Work with your health care provider to regularly review all of the medicines you are taking, including over-the-counter medicines. Ask your health care provider about any side effects that may make you more likely to have a fall. Tell your  health care provider if any medicines that you take make you feel dizzy or sleepy. Osteoporosis screening. Osteoporosis is a condition that causes the bones to get weaker. This can make the bones weak and cause them to break more easily. Blood pressure screening. Blood pressure changes and medicines to control blood pressure can make you feel dizzy. Strength and balance checks. Your health care provider may recommend certain tests to check your strength and balance while standing, walking, or changing positions. Foot health exam. Foot pain and numbness, as well as not wearing proper footwear, can make you more likely to have a fall. Depression screening. You may be more likely to have a fall if you have a fear of falling, feel emotionally low, or feel unable to do activities that you used to do. Alcohol use screening. Using too much alcohol can affect your balance and may make you more likely to have a fall. What actions can I take to lower my risk of falls? General instructions Talk with your health care provider about your risks for falling. Tell your health care provider if: You fall. Be sure to tell your health care provider about all falls, even ones that seem minor. You feel dizzy, sleepy, or off-balance. Take over-the-counter and prescription medicines only as told by your health care provider. These include any supplements. Eat a healthy diet and maintain a healthy weight. A healthy diet includes low-fat dairy products, low-fat (lean) meats, and fiber from whole grains, beans, and lots of fruits and vegetables. Home  safety Remove any tripping hazards, such as rugs, cords, and clutter. Install safety equipment such as grab bars in bathrooms and safety rails on stairs. Keep rooms and walkways well-lit. Activity  Follow a regular exercise program to stay fit. This will help you maintain your balance. Ask your health care provider what types of exercise are appropriate for you. If you need a  cane or walker, use it as recommended by your health care provider. Wear supportive shoes that have nonskid soles. Lifestyle Do not drink alcohol if your health care provider tells you not to drink. If you drink alcohol, limit how much you have: 0-1 drink a day for women. 0-2 drinks a day for men. Be aware of how much alcohol is in your drink. In the [place] one drink equals one typical bottle of beer (12 oz), one-half glass of wine (5 oz), or one shot of hard liquor (1 oz). Do not use any products that contain nicotine or tobacco, such as cigarettes and e-cigarettes. If you need help quitting, ask your health care provider. Summary Having a healthy lifestyle and getting preventive care can help to protect your health and wellness after age 69. Screening and testing are the best way to find a health problem early and help you avoid having a fall. Early diagnosis and treatment give you the best chance for managing medical conditions that are more common for people who are older than age 18. Falls are a major cause of broken bones and head injuries in people who are older than age 71. Take precautions to prevent a fall at home. Work with your health care provider to learn what changes you can make to improve your health and wellness and to prevent falls. This information is not intended to replace advice given to you by your health care provider. Make sure you discuss any questions you have with your health care provider. Document Revised: 05/27/2020 Document Reviewed: 03/04/2020 Elsevier Patient Education  2022 [place].    Michelle Romero , Thank you for taking time to come for your Medicare Wellness Visit. I appreciate your ongoing commitment to your health goals. Please review the following plan we discussed and let me know if I can assist you in the future.   These are the goals we discussed: Bp management This is a list of the screening recommended for you and due dates:  Health  Maintenance  Topic Date Due   DEXA scan (bone density measurement)  06/06/2020   COVID-19 Vaccine (1) 01/06/2021*   Zoster (Shingles) Vaccine (1 of 2) 03/22/2021*   Flu Shot  06/30/2021*   Mammogram  03/01/2022   Cologuard (Stool DNA test)  09/18/2022   Tetanus Vaccine  01/04/2028   Hepatitis C Screening: USPSTF Recommendation to screen - Ages 18-79 yo.  Completed   HPV Vaccine  Aged Out  *Topic was postponed. The date shown is not the original due date.

## 2020-12-21 NOTE — Progress Notes (Signed)
This visit occurred during the SARS-CoV-2 public health emergency.  Safety protocols were in place, including screening questions prior to the visit, additional usage of staff PPE, and extensive cleaning of exam room while observing appropriate contact time as indicated for disinfecting solutions.    Patient ID: Michelle Romero, female  DOB: May 05, 1955, 65 y.o.   MRN: [id number] Patient Care Team    Relationship Specialty Notifications Start End  Natalia Leatherwood, DO PCP - General Family Medicine  12/09/15   Genia Del, MD Consulting Physician Obstetrics and Gynecology  12/09/15   Napoleon Form, MD Consulting Physician Gastroenterology  12/21/20     Chief Complaint  Patient presents with   Medicare Wellness    Subjective: Michelle Romero is a 65 y.o.  Female present for MW susequent and new onset HTN All past medical history, surgical history, allergies, family history, immunizations, medications and social history were updated in the electronic medical record today. All recent labs, ED visits and hospitalizations within the last year were reviewed.  Health maintenance:  Colonoscopy: completed 2011. Cologuard 09/2019- normal. Mammogram: completed:03/01/2020 by GYN Cervical cancer screening: last pap: > 65 Immunizations: tdap UTD 2019, Influenza declined for now (encouraged yearly), PNA series updated today, zostavax declined, covid x3 Infectious disease screening:  Hep C - desires screening today DEXA: last completed 05/2017, has next scheduled.  Hearing: no identified barriers  Routine eye exams.  Assistive device: none Oxygen NIQ:RHYN Patient has a Dental home. Hospitalizations/ED visits: reviewed  Hypertension/HLD: Pt has had elevated BP w/out dx of hypertension. Blood pressures ranges at other offices have been 140s systolic as well.  Patient denies chest pain, shortness of breath or lower extremity edema.  RF: htn, hld. Fhx hd  Functional Status Survey: Is the  patient deaf or have difficulty hearing?: No Does the patient have difficulty seeing, even when wearing glasses/contacts?: No Does the patient have difficulty concentrating, remembering, or making decisions?: No Does the patient have difficulty walking or climbing stairs?: No Does the patient have difficulty dressing or bathing?: No Does the patient have difficulty doing errands alone such as visiting a doctor's office or shopping?: No  Fall Risk  12/21/2020 05/06/2018 01/03/2018 03/15/2017  Falls in the past year? 0 0 No No  Number falls in past yr: 0 0 - -  Injury with Fall? 0 0 - -    Depression screen [facility] 2/9 12/21/2020 01/03/2018 03/15/2017  Decreased Interest 0 0 0  Down, Depressed, Hopeless 0 0 0  PHQ - 2 Score 0 0 0  Altered sleeping 3 - -  Tired, decreased energy 1 - -  Change in appetite 0 - -  Feeling bad or failure about yourself  0 - -  Trouble concentrating 0 - -  Moving slowly or fidgety/restless 0 - -  Suicidal thoughts 0 - -  PHQ-9 Score 4 - -   No flowsheet data found.   Immunization History  Administered Date(s) Administered   Influenza,inj,Quad PF,6+ Mos 03/15/2017, 01/03/2018, 01/14/2019   PNEUMOCOCCAL CONJUGATE-20 12/21/2020   Td 01/03/2018   Tdap 07/07/2007   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   Chicken pox as a child   DJD (degenerative joint disease) of hip 03/17/2012   Dupuytren's contracture of hand 01/30/2012   Elevated BP 11/30/2011   Fracture of distal end of radius 01/07/2018   GERD (gastroesophageal reflux  disease)    Hemorrhoids 12/28/2012   History of blood transfusion    Hypercalcemia    Hyperlipidemia    Measles as a child   Osteopenia 05/2017   T score -1.5 FRAX 8.4% / 0.8%   PONV (postoperative nausea and vomiting)    Precancerous skin lesion 10/30/2011   Left abdomen, excised   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   Vertigo  10/30/2011   Vitamin D deficiency    Allergies  Allergen Reactions   Codeine Other (See Comments)    Fainting   Mobic [Meloxicam]     Vertigo, dizziness, sweating, chest tightness, diarrhea, dehydrated from being outside   Past Surgical History:  Procedure Laterality Date   broke wrist Left 65 yrs old   plate placed by Dr Alphonzo Cruise at [place] on left abdominal wall   TONSILLECTOMY     TOTAL HIP ARTHROPLASTY  04/16/2012   Procedure: TOTAL HIP ARTHROPLASTY ANTERIOR APPROACH;  Surgeon: Gearlean Alf, MD;  Location: [facility];  Service: Orthopedics;  Laterality: Right;   Family History  Problem Relation Age of Onset   Stroke Mother    Hypertension Mother    Diabetes Mother        type 2   Dementia Mother    Arthritis Mother    Arthritis Father    Alzheimer's disease Father    Melanoma Father        neck   Parkinson's disease Father    Fibromyalgia Sister    Pancreatic cancer Maternal Grandmother    Heart attack Maternal Grandfather    Heart disease Maternal Grandfather    Cancer Paternal Grandmother        ?   Heart disease Paternal Grandfather        pacemaker   Ovarian cancer Sister 34   Fibromyalgia Sister    Arthritis Brother    Social History   Social History Narrative   Widowed. 2 children Kathee Delton and Gerrit Heck.   Masters degree. Retired.   Drinks caffeinated beverages. Uses herbal remedies.   Wears her seatbelt. Smoke detector in the home. Firearms in a locked cabinet in the home.   Exercises routinely.   Feels safe in her relationships.    Allergies as of 12/21/2020       Reactions   Codeine Other (See Comments)   Fainting   Mobic [meloxicam]    Vertigo, dizziness, sweating, chest tightness, diarrhea, dehydrated from being outside        Medication List        Accurate as of December 21, 2020  4:51 PM. If you have any questions, ask your nurse or doctor.           STOP taking these medications    cholecalciferol 1000 units tablet Commonly known as: VITAMIN D Stopped by: Howard Pouch, DO   diphenhydrAMINE 25 mg capsule Commonly known as: BENADRYL Stopped by: Howard Pouch, DO   ranitidine 75 MG tablet Commonly known as: ZANTAC Stopped by: Howard Pouch, DO       TAKE these medications    amLODipine 2.5 MG tablet Commonly known as: NORVASC Take 1 tablet (2.5 mg total) by mouth daily. Started by: Howard Pouch, DO   GAVISCON PO Take by mouth as needed.   pantoprazole 40 MG tablet Commonly known as: PROTONIX Take 1 tablet (40 mg total) by mouth daily. 30 minutes before breakfast; office  visit for further refills   Vitamin B 12 250 MCG Lozg Take 1,000 mcg by mouth daily.        All past medical history, surgical history, allergies, family history, immunizations andmedications were updated in the EMR today and reviewed under the history and medication portions of their EMR.     No results found for this or any previous visit (from the past 2160 hour(s)).   ROS: 14 pt review of systems performed and negative (unless mentioned in an HPI)  Objective: BP (!) 148/72   Pulse 64   Temp 97.8 F (36.6 C)   Resp 18   Ht 5' 9.69" (1.77 m)   Wt 172 lb 9.6 oz (78.3 kg)   SpO2 98%   BMI 24.99 kg/m  Gen: Afebrile. No acute distress. Nontoxic in appearance, well-developed, well-nourished, pleasant female HENT: AT. [place]. Bilateral TM visualized and normal in appearance, normal external auditory canal. MMM, no oral lesions, adequate dentition. Bilateral nares within normal limits. Throat without erythema, ulcerations or exudates.  No cough on exam, no hoarseness on exam. Eyes:Pupils Equal Round Reactive to light, Extraocular movements intact,  Conjunctiva without redness, discharge or icterus. Neck/lymp/endocrine: Supple, no lymphadenopathy, no thyromegaly CV: RRR no murmur, no edema, +2/4 P posterior tibialis pulses.  Chest: CTAB, no  wheeze, rhonchi or crackles.  Normal respiratory effort.  Good air movement. Abd: Soft.  Flat. NTND. BS present.  No masses palpated. No hepatosplenomegaly. No rebound tenderness or guarding. Skin: no rashes, purpura or petechiae. Warm and well-perfused. Skin intact. Neuro/Msk: Normal gait. PERLA. EOMi. Alert. Oriented x3.  Cranial nerves II through XII intact. Muscle strength 5/5 upper/lower extremity. DTRs equal bilaterally. Psych: Normal affect, dress and demeanor. Normal speech. Normal thought content and judgment.   No results found.  Assessment/plan: RIELYNN TRULSON is a 65 y.o. female present for medicare subsequent visit with new onset hypertension Need for Streptococcus pneumoniae vaccination - Pneumococcal conjugate vaccine 20-valent  Primary hypertension/HLD: New onset.  - start amlodipine 2.5 mg qd - CBC w/Diff - Comp Met (CMET) - Lipid panel - f/u 2-3 months, then every 6 mos once control established  Encounter for monitoring long-term proton pump inhibitor therapy PPI managed by Dr. Silverio Decamp.  - B12 - Magnesium Elevated glucose - Hemoglobin A1c Disorder of bone and articular cartilage - Vitamin D (25 hydroxy) Need for hepatitis C screening test - Hepatitis C Antibody  Medicare annual wellness visit, subsequent Colonoscopy: completed 2011. Cologuard 09/2019- normal. Mammogram: completed:03/01/2020 by GYN Cervical cancer screening: last pap: > 65 Immunizations: tdap UTD 2019, Influenza declined for now (encouraged yearly), PNA series updated today, zostavax declined, covid x3 Infectious disease screening:  Hep C - desires screening today DEXA: last completed 05/2017, has next scheduled.  Patient was encouraged to exercise greater than 150 minutes a week. Patient was encouraged to choose a diet filled with fresh fruits and vegetables, and lean meats. AVS provided to patient today for education/recommendation on gender specific health and safety maintenance. These  are the goals we discussed: BP management This is a list of the screening recommended for you and due dates:  Health Maintenance  Topic Date Due   DEXA scan (bone density measurement)  06/06/2020   COVID-19 Vaccine (1) 01/06/2021*   Zoster (Shingles) Vaccine (1 of 2) 03/22/2021*   Flu Shot  06/30/2021*   Mammogram  03/01/2022   Cologuard (Stool DNA test)  09/18/2022   Tetanus Vaccine  01/04/2028   Hepatitis C Screening: USPSTF Recommendation to screen -  Ages 26-79 yo.  Completed   HPV Vaccine  Aged Out  *Topic was postponed. The date shown is not the original due date.     Return in about 3 months (around 03/22/2021) for [place] (30 min) and yearly for Medicare wellness.   Orders Placed This Encounter  Procedures   Pneumococcal conjugate vaccine 20-valent   CBC w/Diff   Comp Met (CMET)   Lipid panel   Hemoglobin A1c   Vitamin D (25 hydroxy)   B12   Magnesium   Hepatitis C Antibody    Meds ordered this encounter  Medications   amLODipine (NORVASC) 2.5 MG tablet    Sig: Take 1 tablet (2.5 mg total) by mouth daily.    Dispense:  90 tablet    Refill:  1    Referral Orders  No referral(s) requested today     Electronically signed by: Howard Pouch, [place]

## 2020-12-22 ENCOUNTER — Telehealth: Payer: Self-pay | Admitting: Family Medicine

## 2020-12-22 LAB — COMPREHENSIVE METABOLIC PANEL
AG Ratio: 1.7 (calc) (ref 1.0–2.5)
ALT: 12 U/L (ref 6–29)
AST: 15 U/L (ref 10–35)
Albumin: 4.3 g/dL (ref 3.6–5.1)
Alkaline phosphatase (APISO): 94 U/L (ref 37–153)
BUN: 13 mg/dL (ref 7–25)
CO2: 26 mmol/L (ref 20–32)
Calcium: 9.7 mg/dL (ref 8.6–10.4)
Chloride: 102 mmol/L (ref 98–110)
Creat: 0.74 mg/dL (ref 0.50–1.05)
Globulin: 2.5 g/dL (calc) (ref 1.9–3.7)
Glucose, Bld: 80 mg/dL (ref 65–99)
Potassium: 4.5 mmol/L (ref 3.5–5.3)
Sodium: 138 mmol/L (ref 135–146)
Total Bilirubin: 0.9 mg/dL (ref 0.2–1.2)
Total Protein: 6.8 g/dL (ref 6.1–8.1)

## 2020-12-22 LAB — CBC WITH DIFFERENTIAL/PLATELET
Absolute Monocytes: 483 cells/uL (ref 200–950)
Basophils Absolute: 41 cells/uL (ref 0–200)
Basophils Relative: 0.6 %
Eosinophils Absolute: 138 cells/uL (ref 15–500)
Eosinophils Relative: 2 %
HCT: 40.8 % (ref 35.0–45.0)
Hemoglobin: 13.7 g/dL (ref 11.7–15.5)
Lymphs Abs: 1649 cells/uL (ref 850–3900)
MCH: 31.3 pg (ref 27.0–33.0)
MCHC: 33.6 g/dL (ref 32.0–36.0)
MCV: 93.2 fL (ref 80.0–100.0)
MPV: 10.7 fL (ref 7.5–12.5)
Monocytes Relative: 7 %
Neutro Abs: 4589 cells/uL (ref 1500–7800)
Neutrophils Relative %: 66.5 %
Platelets: 238 10*3/uL (ref 140–400)
RBC: 4.38 10*6/uL (ref 3.80–5.10)
RDW: 11.7 % (ref 11.0–15.0)
Total Lymphocyte: 23.9 %
WBC: 6.9 10*3/uL (ref 3.8–10.8)

## 2020-12-22 LAB — LIPID PANEL
Cholesterol: 282 mg/dL — ABNORMAL HIGH (ref <200)
HDL: 77 mg/dL (ref >=50)
LDL Cholesterol (Calc): 180 mg/dL (calc) — ABNORMAL HIGH
Non-HDL Cholesterol (Calc): 205 mg/dL (calc) — ABNORMAL HIGH (ref <130)
Total CHOL/HDL Ratio: 3.7 (calc) (ref <5.0)
Triglycerides: 121 mg/dL (ref <150)

## 2020-12-22 LAB — HEPATITIS C ANTIBODY
Hepatitis C Ab: NONREACTIVE
SIGNAL TO CUT-OFF: 0.01 (ref <1.00)

## 2020-12-22 LAB — MAGNESIUM: Magnesium: 2.1 mg/dL (ref 1.5–2.5)

## 2020-12-22 LAB — VITAMIN B12: Vitamin B-12: 1430 pg/mL — ABNORMAL HIGH (ref 200–1100)

## 2020-12-22 LAB — HEMOGLOBIN A1C
Hgb A1c MFr Bld: 5.2 % of total Hgb (ref <5.7)
Mean Plasma Glucose: 103 mg/dL
eAG (mmol/L): 5.7 mmol/L

## 2020-12-22 LAB — VITAMIN D 25 HYDROXY (VIT D DEFICIENCY, FRACTURES): Vit D, 25-Hydroxy: 22 ng/mL — ABNORMAL LOW (ref 30–100)

## 2020-12-22 NOTE — Telephone Encounter (Signed)
noted 

## 2020-12-22 NOTE — Telephone Encounter (Signed)
Please call patient Liver, kidney and thyroid function are normal Blood cell counts and electrolytes are normal Diabetes screening/A1c is normal at 5.2 Hepatitis C screening is negative. B12 levels are normal and look great. Magnesium levels are normal. Vitamin D levels are low at 22.  Normal is greater than 30, and for bone health we recommend levels between 40-50.  I would encourage her to add vitamin D3 1000 units daily (this is over-the-counter). Cholesterol panel   - Considering all factors including gender, age, elevated blood pressure/hypertension, family history of heart attack/stroke and her current cholesterol level her cardiovascular risk for heart attack and stroke in the next 10 years is 10.9%.  This does meet criteria to start a medication that can help provide extra protection against heart attack/stroke and help her lower her cholesterol.  Goal cholesterol for her should be at least below 130.  In addition increasing exercise to at least 150 minutes a week, avoiding fatty meats, limit red meat, if using butter- use plant-based, and if using oil-use olive oil.  Avoid deep-fried foods.   If she is agreeable to start medication for her cholesterol, I will call in a medication called Lipitor for her to start nightly.  We will need to follow-up 8-12 weeks with provider for recheck if starting medication.  Please advise on her decision

## 2020-12-22 NOTE — Telephone Encounter (Signed)
Spoke with pt regarding labs and instructions. Pt declined.

## 2020-12-23 ENCOUNTER — Telehealth: Payer: Self-pay | Admitting: Genetic Counselor

## 2020-12-23 NOTE — Telephone Encounter (Signed)
Scheduled appt per 9/23 referral. Pt is aware of appt date and time.  

## 2021-01-04 ENCOUNTER — Inpatient Hospital Stay: Payer: Medicare Other | Attending: Genetic Counselor | Admitting: Genetic Counselor

## 2021-01-04 ENCOUNTER — Inpatient Hospital Stay: Payer: Medicare Other

## 2021-01-09 ENCOUNTER — Telehealth: Payer: Self-pay | Admitting: Genetic Counselor

## 2021-01-09 NOTE — Telephone Encounter (Signed)
Pt missed appt from last week. Called and spoke to pt, she requested to r/s to December. R/s appt per pt's preference. She is aware of new appt date and time.

## 2021-02-10 ENCOUNTER — Ambulatory Visit: Payer: Medicare Other | Admitting: Gastroenterology

## 2021-03-08 ENCOUNTER — Inpatient Hospital Stay: Payer: Medicare Other | Attending: Genetic Counselor | Admitting: Genetic Counselor

## 2021-03-08 ENCOUNTER — Other Ambulatory Visit: Payer: Self-pay

## 2021-03-08 ENCOUNTER — Inpatient Hospital Stay: Payer: Medicare Other

## 2021-03-08 DIAGNOSIS — Z8041 Family history of malignant neoplasm of ovary: Secondary | ICD-10-CM | POA: Diagnosis not present

## 2021-03-08 DIAGNOSIS — Z800 Family history of malignant neoplasm of digestive organs: Secondary | ICD-10-CM | POA: Diagnosis not present

## 2021-03-09 ENCOUNTER — Encounter: Payer: Self-pay | Admitting: Genetic Counselor

## 2021-03-09 DIAGNOSIS — Z800 Family history of malignant neoplasm of digestive organs: Secondary | ICD-10-CM | POA: Insufficient documentation

## 2021-03-09 DIAGNOSIS — Z1379 Encounter for other screening for genetic and chromosomal anomalies: Secondary | ICD-10-CM | POA: Insufficient documentation

## 2021-03-09 DIAGNOSIS — Z8041 Family history of malignant neoplasm of ovary: Secondary | ICD-10-CM | POA: Insufficient documentation

## 2021-03-09 NOTE — Progress Notes (Signed)
REFERRING PROVIDER: Howard Pouch A, DO [address] [place],  [place] [address]  PRIMARY PROVIDER:  Raoul Pitch, Renee A, DO  PRIMARY REASON FOR VISIT:  1. Family history of ovarian cancer   2. Family history of pancreatic cancer     HISTORY OF PRESENT ILLNESS:   Michelle Romero, a 65 y.o. female, was seen for a  cancer genetics consultation at the request of Dr. Raoul Pitch due to a family history of cancer.  Michelle Romero presents to clinic today to discuss the possibility of a hereditary predisposition to cancer, to discuss genetic testing, and to further clarify her future cancer risks, as well as potential cancer risks for family members.   Michelle Romero is a 65 y.o. female with no personal history of cancer.     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   Chicken pox as a child   DJD (degenerative joint disease) of hip 03/17/2012   Dupuytren's contracture of hand 01/30/2012   Elevated BP 11/30/2011   Fracture of distal end of radius 01/07/2018   GERD (gastroesophageal reflux disease)    Hemorrhoids 12/28/2012   History of blood transfusion    Hypercalcemia    Hyperlipidemia    Measles as a child   Osteopenia 05/2017   T score -1.5 FRAX 8.4% / 0.8%   PONV (postoperative nausea and vomiting)    Precancerous skin lesion 10/30/2011   Left abdomen, excised   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   Vertigo 10/30/2011   Vitamin D deficiency     Past Surgical History:  Procedure Laterality Date   broke wrist Left 65 yrs old   plate placed by Dr Alphonzo Cruise at [place] on left abdominal wall   TONSILLECTOMY     TOTAL HIP ARTHROPLASTY  04/16/2012   Procedure: TOTAL HIP ARTHROPLASTY ANTERIOR APPROACH;  Surgeon: Gearlean Alf, MD;  Location: [facility];  Service: Orthopedics;  Laterality: Right;    Social History    Socioeconomic History   Marital status: Widowed    Spouse name: Not on file   Number of children: 2   Years of education: 89   Highest education level: Not on file  Occupational History   Occupation: retired  Tobacco Use   Smoking status: Never   Smokeless tobacco: Never  Vaping Use   Vaping Use: Never used  Substance and Sexual Activity   Alcohol use: Not Currently    Alcohol/week: 1.0 standard drink    Types: 1 Standard drinks or equivalent per week   Drug use: No   Sexual activity: Not Currently    Comment: 1st intercourse- 27, partner- 1, widow  Other Topics Concern   Not on file  Social History Narrative   Widowed. 2 children Kathee Delton and Gerrit Heck.   Masters degree. Retired.   Drinks caffeinated beverages. Uses herbal remedies.   Wears her seatbelt. Smoke detector in the home. Firearms in a locked cabinet in the home.   Exercises routinely.   Feels safe in her relationships.   Social Determinants of Health   Financial Resource Strain: Not on file  Food Insecurity: Not on file  Transportation Needs: Not on file  Physical Activity: Not on file  Stress: Not on file  Social Connections: Not on file  FAMILY HISTORY:  We obtained a detailed, 4-generation family history.  Significant diagnoses are listed below: Family History  Problem Relation Age of Onset   Stroke Mother    Hypertension Mother    Diabetes Mother        type 2   Dementia Mother    Arthritis Mother    Arthritis Father    Alzheimer's disease Father    Melanoma Father        neck   Parkinson's disease Father    Fibromyalgia Sister    Pancreatic cancer Maternal Grandmother    Heart attack Maternal Grandfather    Heart disease Maternal Grandfather    Cancer Paternal Grandmother        ?   Heart disease Paternal Grandfather        pacemaker   Ovarian cancer Sister 60   Fibromyalgia Sister    Arthritis Brother     GENETIC COUNSELING ASSESSMENT: Michelle Romero is a 65  y.o. female with a family history of cancer. She was scheduled because she thought she only had genetic testing for BRCA1/2. However, after reviewing her genetic testings results that she brought to the appointment, she actually had a very comprehensive panel. Therefore, we discussed her results.   DISCUSSION: The [facility] CancerNext-Expanded Panel found no pathogenic mutations.   The CancerNext-Expanded gene panel offered by [facility] and includes sequencing, rearrangement, and RNA analysis for the following 77 genes: AIP, ALK, APC, ATM, AXIN2, BAP1, BARD1, BLM, BMPR1A, BRCA1, BRCA2, BRIP1, CDC73, CDH1, CDK4, CDKN1B, CDKN2A, CHEK2, CTNNA1, DICER1, FANCC, FH, FLCN, GALNT12, KIF1B, LZTR1, MAX, MEN1, MET, MLH1, MSH2, MSH3, MSH6, MUTYH, NBN, NF1, NF2, NTHL1, PALB2, PHOX2B, PMS2, POT1, PRKAR1A, PTCH1, PTEN, RAD51C, RAD51D, RB1, RECQL, RET, SDHA, SDHAF2, SDHB, SDHC, SDHD, SMAD4, SMARCA4, SMARCB1, SMARCE1, STK11, SUFU, TMEM127, TP53, TSC1, TSC2, VHL and XRCC2 (sequencing and deletion/duplication); EGFR, EGLN1, HOXB13, KIT, MITF, PDGFRA, POLD1, and POLE (sequencing only); EPCAM and GREM1 (deletion/duplication only).    Even though a pathogenic variant was not identified, possible explanations for the cancer in the family may include: There may be no hereditary risk for cancer in the family. The cancers in her family may be due to other genetic or environmental factors. There may be a gene mutation in one of these genes that current testing methods cannot detect, but that chance is small. There could be another gene that has not yet been discovered, or that we have not yet tested, that is responsible for the cancer diagnoses in the family.  It is also possible there is a hereditary cause for the cancer in the family that [name] did not inherit.  Therefore, it is important to remain in touch with cancer genetics in the future so that we can continue to offer Michelle Romero the most up to date genetic  testing.   ADDITIONAL GENETIC TESTING:  We discussed with [name] that her genetic testing was fairly extensive.  If there are genes identified to increase cancer risk that can be analyzed in the future, we would be happy to discuss and coordinate this testing at that time.     CANCER SCREENING RECOMMENDATIONS:  Ms. Nordstrom test result is considered negative (normal).  This means that we have not identified a hereditary cause for her family history of cancer at this time.   An individual's cancer risk and medical management are not determined by genetic test results alone. Overall cancer risk assessment incorporates additional factors, including personal medical history, family history, and any available  genetic information that may result in a personalized plan for cancer prevention and surveillance. Therefore, it is recommended she continue to follow the cancer management and screening guidelines provided by her primary healthcare provider.  RECOMMENDATIONS FOR FAMILY MEMBERS:   Since she did not inherit a mutation in a cancer predisposition gene included on this panel, her children could not have inherited a mutation from her in one of these genes. Other members of the family may still carry a pathogenic variant in one of these genes that Ms. Tavares did not inherit. Based on the family history, we recommend her siblings and parents have genetic counseling and testing. Ms. Ferdinand will let us know if we can be of any assistance in coordinating genetic counseling and/or testing for this family member.    Ms. Fiske questions were answered to her satisfaction today. Our contact information was provided should additional questions or concerns arise. Thank you for the referral and allowing [place] to share in the care of your patient.   Lucille Passy, MS, [facility] Genetic Counselor [email or web address] ([email or web address]) [phone]  The patient was seen for a total of 10 minutes in face-to-face  genetic counseling. The patient was seen alone.  Drs. Magrinat, Lindi Adie and/or Burr Medico were available to discuss this case as needed.  _______________________________________________________________________ For Office Staff:  Number of people involved in session: 1 Was an Intern/ student involved with case: no

## 2021-03-22 ENCOUNTER — Ambulatory Visit: Payer: Medicare Other | Admitting: Family Medicine

## 2021-03-28 ENCOUNTER — Other Ambulatory Visit: Payer: Self-pay | Admitting: Family Medicine

## 2021-03-28 DIAGNOSIS — Z8249 Family history of ischemic heart disease and other diseases of the circulatory system: Secondary | ICD-10-CM

## 2021-03-28 DIAGNOSIS — E785 Hyperlipidemia, unspecified: Secondary | ICD-10-CM

## 2021-03-30 ENCOUNTER — Other Ambulatory Visit: Payer: Self-pay | Admitting: Obstetrics and Gynecology

## 2021-03-30 DIAGNOSIS — R928 Other abnormal and inconclusive findings on diagnostic imaging of breast: Secondary | ICD-10-CM

## 2021-04-04 ENCOUNTER — Ambulatory Visit
Admission: RE | Admit: 2021-04-04 | Discharge: 2021-04-04 | Disposition: A | Discharge status: Home / Self Care | Payer: Medicare Other | Source: Ambulatory Visit | Attending: Obstetrics and Gynecology | Admitting: Obstetrics and Gynecology

## 2021-04-04 DIAGNOSIS — R928 Other abnormal and inconclusive findings on diagnostic imaging of breast: Secondary | ICD-10-CM

## 2021-04-14 ENCOUNTER — Ambulatory Visit (INDEPENDENT_AMBULATORY_CARE_PROVIDER_SITE_OTHER): Payer: Medicare Other | Admitting: Gastroenterology

## 2021-04-14 ENCOUNTER — Encounter: Payer: Self-pay | Admitting: Gastroenterology

## 2021-04-14 VITALS — BP 138/70 | HR 70 | Ht 70.0 in | Wt 173.0 lb

## 2021-04-14 DIAGNOSIS — K219 Gastro-esophageal reflux disease without esophagitis: Secondary | ICD-10-CM | POA: Diagnosis not present

## 2021-04-14 MED ORDER — FAMOTIDINE 20 MG PO TABS: 20.0000 mg | ORAL_TABLET | Freq: Every day | ORAL | 3 refills | Status: DC

## 2021-04-14 MED ORDER — PANTOPRAZOLE SODIUM 40 MG PO TBEC: 40.0000 mg | DELAYED_RELEASE_TABLET | Freq: Every day | ORAL | 3 refills | Status: DC

## 2021-04-14 NOTE — Progress Notes (Signed)
Michelle Romero    [id number]    1956-03-03  Primary Care Physician:Reddy, Jama Flavors, MD  Referring Physician: Ma Hillock, DO [address] [place],  [place] [address]   Chief complaint:  GERD  HPI:  66 yr F here for management of GERD and related symptoms.  Last office visit was in August 2019 Currently her symptoms under control on daily pantoprazole Denies any dysphagia, vomiting, abdominal pain, change in bowel habits or rectal bleeding  Denies any loss of appetite, abdominal pain, vomiting or weight loss No family history of GI malignancy.   Colonoscopy 01/25/2009 Normal colonoscopy  Cologuard 10/2019 Negative    Outpatient Encounter Medications as of 04/14/2021  Medication Sig   Alum Hydroxide-Mag Carbonate (GAVISCON PO) Take by mouth as needed.   Cyanocobalamin (VITAMIN B 12) 250 MCG LOZG Take 1,000 mcg by mouth daily as needed.   pantoprazole (PROTONIX) 40 MG tablet Take 1 tablet (40 mg total) by mouth daily. 30 minutes before breakfast; office visit for further refills   [DISCONTINUED] amLODipine (NORVASC) 2.5 MG tablet Take 1 tablet (2.5 mg total) by mouth daily.   No facility-administered encounter medications on file as of 04/14/2021.    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   Chicken pox as a child   DJD (degenerative joint disease) of hip 03/17/2012   Dupuytren's contracture of hand 01/30/2012   Elevated BP 11/30/2011   Fracture of distal end of radius 01/07/2018   GERD (gastroesophageal reflux disease)    Hemorrhoids 12/28/2012   History of blood transfusion    Hypercalcemia    Hyperlipidemia    Measles as a child   Osteopenia 05/2017   T score -1.5 FRAX 8.4% / 0.8%   PONV  (postoperative nausea and vomiting)    Precancerous skin lesion 10/30/2011   Left abdomen, excised   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   Vertigo 10/30/2011   Vitamin D deficiency     Past Surgical History:  Procedure Laterality Date   broke wrist Left 66 yrs old   plate placed by Dr Alphonzo Cruise at [place] on left abdominal wall   TONSILLECTOMY     TOTAL HIP ARTHROPLASTY  04/16/2012   Procedure: TOTAL HIP ARTHROPLASTY ANTERIOR APPROACH;  Surgeon: Gearlean Alf, MD;  Location: [facility];  Service: Orthopedics;  Laterality: Right;    Family History  Problem Relation Age of Onset   Stroke Mother    Hypertension Mother    Diabetes Mother        type 2   Dementia Mother    Arthritis Mother    Arthritis Father    Alzheimer's disease Father    Melanoma Father        neck   Parkinson's disease Father    Fibromyalgia Sister    Ovarian cancer Sister 56   Fibromyalgia Sister    Endometriosis Sister        severe, had hyst   Arthritis Brother    Pancreatic cancer Maternal Grandmother    Heart attack Maternal Grandfather    Heart disease Maternal  Grandfather    Cancer Paternal Grandmother        ?   Heart disease Paternal Grandfather        pacemaker    Social History   Socioeconomic History   Marital status: Widowed    Spouse name: Not on file   Number of children: 2   Years of education: 43   Highest education level: Not on file  Occupational History   Occupation: retired  Tobacco Use   Smoking status: Never   Smokeless tobacco: Never  Vaping Use   Vaping Use: Never used  Substance and Sexual Activity   Alcohol use: Not Currently    Alcohol/week: 1.0 standard drink    Types: 1 Standard drinks or equivalent per week   Drug use: No   Sexual activity: Not Currently    Comment: 1st intercourse- 27, partner- 1, widow  Other Topics Concern   Not on file  Social History Narrative   Widowed. 2  children Kathee Delton and Gerrit Heck.   Masters degree. Retired.   Drinks caffeinated beverages. Uses herbal remedies.   Wears her seatbelt. Smoke detector in the home. Firearms in a locked cabinet in the home.   Exercises routinely.   Feels safe in her relationships.   Social Determinants of Health   Financial Resource Strain: Not on file  Food Insecurity: Not on file  Transportation Needs: Not on file  Physical Activity: Not on file  Stress: Not on file  Social Connections: Not on file  Intimate Partner Violence: Not on file      Review of systems: All other review of systems negative except as mentioned in the HPI.   Physical Exam: Vitals:   04/14/21 1023  BP: 138/70  Pulse: 70   Body mass index is 24.82 kg/m. Gen:      No acute distress HEENT:  sclera anicteric Abd:      soft, non-tender; no palpable masses, no distension Ext:    No edema Neuro: alert and oriented x 3 Psych: normal mood and affect  Data Reviewed:  Reviewed labs, radiology imaging, old records and pertinent past GI work up   Assessment and Plan/Recommendations:  66 year old very pleasant female with chronic GERD  GERD: Use pantoprazole 40 mg daily, 30 minutes before breakfast and add Pepcid in the evening as needed Antireflux measures  Colorectal cancer screening: Average risk Either repeat cologaurd in 2024 or colonoscopy for screening  Advised patient to call with any changes in symptoms  Return in 2 years   The patient was provided an opportunity to ask questions and all were answered. The patient agreed with the plan and demonstrated an understanding of the instructions.  Damaris Hippo , MD    CC: Ma Hillock, DO

## 2021-04-14 NOTE — Patient Instructions (Signed)
We have sent Pantoprazole and Pepcid to your pharmacy  Follow up in 2 years  Cologard due 2024  Gastroesophageal Reflux Disease, Adult Gastroesophageal reflux (GER) happens when acid from the stomach flows up into the tube that connects the mouth and the stomach (esophagus). Normally, food travels down the esophagus and stays in the stomach to be digested. However, when a person has GER, food and stomach acid sometimes move back up into the esophagus. If this becomes a more serious problem, the person may be diagnosed with a disease called gastroesophageal reflux disease (GERD). GERD occurs when the reflux: Happens often. Causes frequent or severe symptoms. Causes problems such as damage to the esophagus. When stomach acid comes in contact with the esophagus, the acid may cause inflammation in the esophagus. Over time, GERD may create small holes (ulcers) in the lining of the esophagus. What are the causes? This condition is caused by a problem with the muscle between the esophagus and the stomach (lower esophageal sphincter, or LES). Normally, the LES muscle closes after food passes through the esophagus to the stomach. When the LES is weakened or abnormal, it does not close properly, and that allows food and stomach acid to go back up into the esophagus. The LES can be weakened by certain dietary substances, medicines, and medical conditions, including: Tobacco use. Pregnancy. Having a hiatal hernia. Alcohol use. Certain foods and beverages, such as coffee, chocolate, onions, and peppermint. What increases the risk? You are more likely to develop this condition if you: Have an increased body weight. Have a connective tissue disorder. Take NSAIDs, such as ibuprofen. What are the signs or symptoms? Symptoms of this condition include: Heartburn. Difficult or painful swallowing and the feeling of having a lump in the throat. A bitter taste in the mouth. Bad breath and having a large  amount of saliva. Having an upset or bloated stomach and belching. Chest pain. Different conditions can cause chest pain. Make sure you see your health care provider if you experience chest pain. Shortness of breath or wheezing. Ongoing (chronic) cough or a nighttime cough. Wearing away of tooth enamel. Weight loss. How is this diagnosed? This condition may be diagnosed based on a medical history and a physical exam. To determine if you have mild or severe GERD, your health care provider may also monitor how you respond to treatment. You may also have tests, including: A test to examine your stomach and esophagus with a small camera (endoscopy). A test that measures the acidity level in your esophagus. A test that measures how much pressure is on your esophagus. A barium swallow or modified barium swallow test to show the shape, size, and functioning of your esophagus. How is this treated? Treatment for this condition may vary depending on how severe your symptoms are. Your health care provider may recommend: Changes to your diet. Medicine. Surgery. The goal of treatment is to help relieve your symptoms and to prevent complications. Follow these instructions at home: Eating and drinking  Follow a diet as recommended by your health care provider. This may involve avoiding foods and drinks such as: Coffee and tea, with or without caffeine. Drinks that contain alcohol. Energy drinks and sports drinks. Carbonated drinks or sodas. Chocolate and cocoa. Peppermint and mint flavorings. Garlic and onions. Horseradish. Spicy and acidic foods, including peppers, chili powder, curry powder, vinegar, hot sauces, and barbecue sauce. Citrus fruit juices and citrus fruits, such as oranges, lemons, and limes. Tomato-based foods, such as red sauce, chili,  salsa, and pizza with red sauce. Fried and fatty foods, such as donuts, french fries, potato chips, and high-fat dressings. High-fat meats, such  as hot dogs and fatty cuts of red and white meats, such as rib eye steak, sausage, ham, and bacon. High-fat dairy items, such as whole milk, butter, and cream cheese. Eat small, frequent meals instead of large meals. Avoid drinking large amounts of liquid with your meals. Avoid eating meals during the 2-3 hours before bedtime. Avoid lying down right after you eat. Do not exercise right after you eat. Lifestyle  Do not use any products that contain nicotine or tobacco. These products include cigarettes, chewing tobacco, and vaping devices, such as e-cigarettes. If you need help quitting, ask your health care provider. Try to reduce your stress by using methods such as yoga or meditation. If you need help reducing stress, ask your health care provider. If you are overweight, reduce your weight to an amount that is healthy for you. Ask your health care provider for guidance about a safe weight loss goal. General instructions Pay attention to any changes in your symptoms. Take over-the-counter and prescription medicines only as told by your health care provider. Do not take aspirin, ibuprofen, or other NSAIDs unless your health care provider told you to take these medicines. Wear loose-fitting clothing. Do not wear anything tight around your waist that causes pressure on your abdomen. Raise (elevate) the head of your bed about 6 inches (15 cm). You can use a wedge to do this. Avoid bending over if this makes your symptoms worse. Keep all follow-up visits. This is important. Contact a health care provider if: You have: New symptoms. Unexplained weight loss. Difficulty swallowing or it hurts to swallow. Wheezing or a persistent cough. A hoarse voice. Your symptoms do not improve with treatment. Get help right away if: You have sudden pain in your arms, neck, jaw, teeth, or back. You suddenly feel sweaty, dizzy, or light-headed. You have chest pain or shortness of breath. You vomit and the  vomit is green, yellow, or black, or it looks like blood or coffee grounds. You faint. You have stool that is red, bloody, or black. You cannot swallow, drink, or eat. These symptoms may represent a serious problem that is an emergency. Do not wait to see if the symptoms will go away. Get medical help right away. Call your local emergency services (911 in the [place] Do not drive yourself to the hospital. Summary Gastroesophageal reflux happens when acid from the stomach flows up into the esophagus. GERD is a disease in which the reflux happens often, causes frequent or severe symptoms, or causes problems such as damage to the esophagus. Treatment for this condition may vary depending on how severe your symptoms are. Your health care provider may recommend diet and lifestyle changes, medicine, or surgery. Contact a health care provider if you have new or worsening symptoms. Take over-the-counter and prescription medicines only as told by your health care provider. Do not take aspirin, ibuprofen, or other NSAIDs unless your health care provider told you to do so. Keep all follow-up visits as told by your health care provider. This is important. This information is not intended to replace advice given to you by your health care provider. Make sure you discuss any questions you have with your health care provider. Document Revised: 09/28/2019 Document Reviewed: 09/28/2019 Elsevier Patient Education  2022 [employer].   I appreciate the  opportunity to care for you  Thank You  Harl Bowie , MD

## 2021-04-21 ENCOUNTER — Ambulatory Visit
Admission: RE | Admit: 2021-04-21 | Discharge: 2021-04-21 | Disposition: A | Discharge status: Home / Self Care | Payer: No Typology Code available for payment source | Source: Ambulatory Visit | Attending: Family Medicine | Admitting: Family Medicine

## 2021-04-21 DIAGNOSIS — Z8249 Family history of ischemic heart disease and other diseases of the circulatory system: Secondary | ICD-10-CM

## 2021-04-21 DIAGNOSIS — E785 Hyperlipidemia, unspecified: Secondary | ICD-10-CM

## 2021-04-25 ENCOUNTER — Encounter: Payer: Self-pay | Admitting: Gastroenterology

## 2021-06-02 ENCOUNTER — Ambulatory Visit
Admission: RE | Admit: 2021-06-02 | Discharge: 2021-06-02 | Disposition: A | Discharge status: Home / Self Care | Payer: Medicare Other | Source: Ambulatory Visit | Attending: Obstetrics and Gynecology | Admitting: Obstetrics and Gynecology

## 2021-06-02 DIAGNOSIS — M8580 Other specified disorders of bone density and structure, unspecified site: Secondary | ICD-10-CM

## 2022-02-08 ENCOUNTER — Telehealth: Payer: Self-pay | Admitting: Gastroenterology

## 2022-02-09 ENCOUNTER — Other Ambulatory Visit: Payer: Self-pay

## 2022-02-09 NOTE — Telephone Encounter (Signed)
Noted  

## 2022-02-09 NOTE — Telephone Encounter (Signed)
Pharmacy updated in chart

## 2022-04-13 ENCOUNTER — Other Ambulatory Visit: Payer: Self-pay | Admitting: *Deleted

## 2022-04-13 MED ORDER — PANTOPRAZOLE SODIUM 40 MG PO TBEC: 40.0000 mg | DELAYED_RELEASE_TABLET | Freq: Every day | ORAL | 3 refills | Status: DC

## 2022-04-15 ENCOUNTER — Other Ambulatory Visit: Payer: Self-pay | Admitting: Gastroenterology

## 2022-12-19 LAB — EXTERNAL GENERIC LAB PROCEDURE

## 2023-01-07 LAB — EXTERNAL GENERIC LAB PROCEDURE: COLOGUARD: NEGATIVE

## 2023-01-07 LAB — COLOGUARD: COLOGUARD: NEGATIVE

## 2023-02-22 ENCOUNTER — Other Ambulatory Visit: Payer: Self-pay | Admitting: Gastroenterology

## 2023-05-23 ENCOUNTER — Other Ambulatory Visit: Payer: Self-pay | Admitting: Family Medicine

## 2023-05-23 DIAGNOSIS — Z1231 Encounter for screening mammogram for malignant neoplasm of breast: Secondary | ICD-10-CM

## 2023-05-28 ENCOUNTER — Other Ambulatory Visit: Payer: Self-pay | Admitting: Gastroenterology

## 2023-06-07 ENCOUNTER — Ambulatory Visit
Admission: RE | Admit: 2023-06-07 | Discharge: 2023-06-07 | Disposition: A | Discharge status: Home / Self Care | Payer: Medicare Other | Source: Ambulatory Visit | Attending: Family Medicine | Admitting: Family Medicine

## 2023-06-07 DIAGNOSIS — Z1231 Encounter for screening mammogram for malignant neoplasm of breast: Secondary | ICD-10-CM

## 2023-08-14 ENCOUNTER — Other Ambulatory Visit: Payer: Self-pay | Admitting: Gastroenterology

## 2023-09-29 ENCOUNTER — Other Ambulatory Visit: Payer: Self-pay | Admitting: Gastroenterology

## 2023-10-01 ENCOUNTER — Telehealth: Payer: Self-pay | Admitting: Gastroenterology

## 2023-10-01 MED ORDER — PANTOPRAZOLE SODIUM 40 MG PO TBEC: 40.0000 mg | DELAYED_RELEASE_TABLET | Freq: Every day | ORAL | 1 refills | Status: DC

## 2023-10-01 NOTE — Telephone Encounter (Signed)
 Inbound call from patient, requesting refill for pantoprazole  sent to [facility].

## 2023-10-01 NOTE — Telephone Encounter (Signed)
 Patients pantoprazole  sent to [facility] pharmacy as requested   Patient scheduled for a follow up in Aug

## 2023-11-09 IMAGING — CT CT CARDIAC CORONARY ARTERY CALCIUM SCORE
3 series · 14 of 20 positions shown, 16 images · non-contrast
Comparison: None.

CLINICAL DATA: Hyperlipidemia

EXAM:
CT CARDIAC CORONARY ARTERY CALCIUM SCORE
TECHNIQUE: Non-contrast imaging through the heart was performed using
prospective ECG gating. Image post processing was performed on an
independent workstation, allowing for quantitative analysis of the
heart and coronary arteries. Note that this exam targets the heart
and the chest was not imaged in its entirety.

[Series 2: calcium scoring 2.00 qr36 bestdiast 70% hrt calciu · axial · 0.39mm/px · z∈[+1624,+1720]mm · 4 of 80 slices shown]
[im 16/80  vessel]
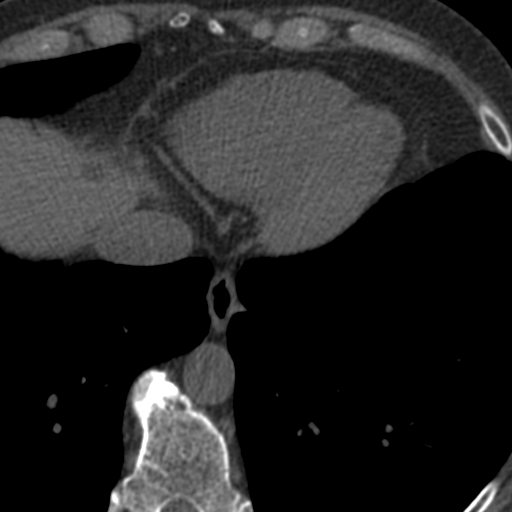
[im 32/80  vessel]
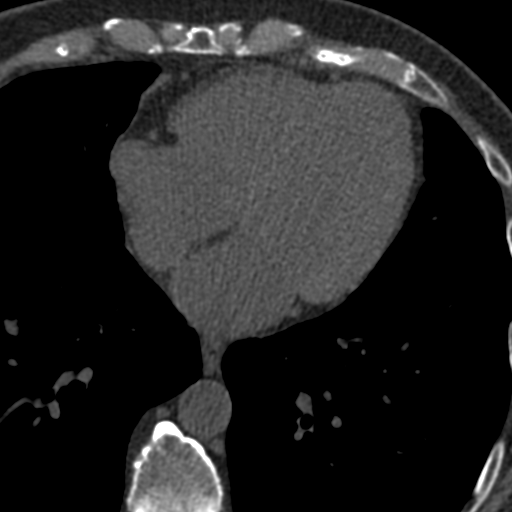
[im 48/80  vessel]
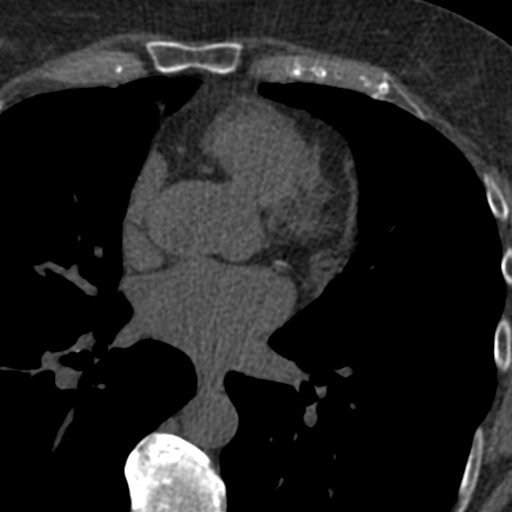
[im 64/80  vessel]
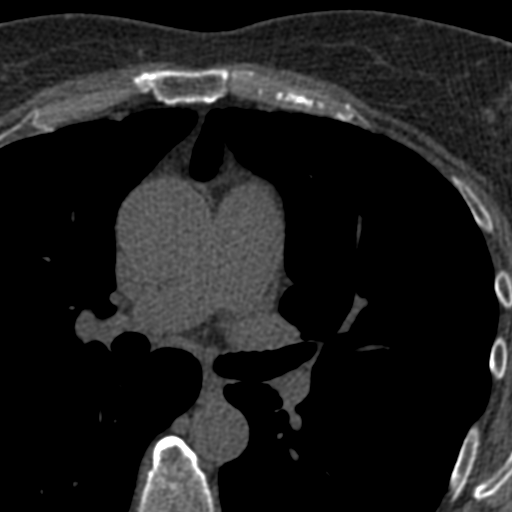

[Series 3: calcium scoring 2.00 br40 bestdiast 70% axial · axial · 0.58mm/px · z∈[+1620,+1724]mm · 5 of 80 slices shown, 7 images]
[im 14/80  vessel]
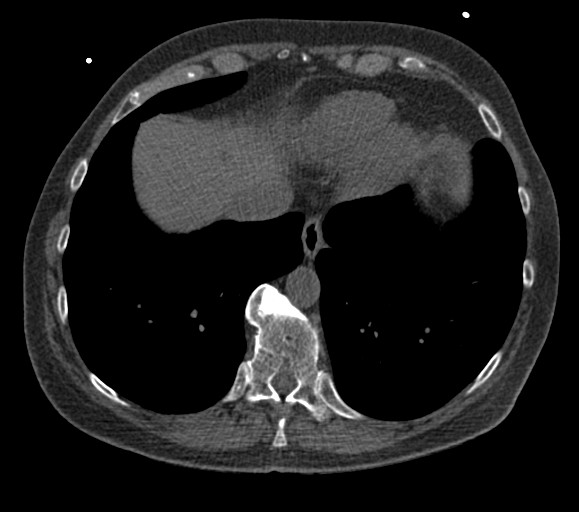
[im 14/80  lung]
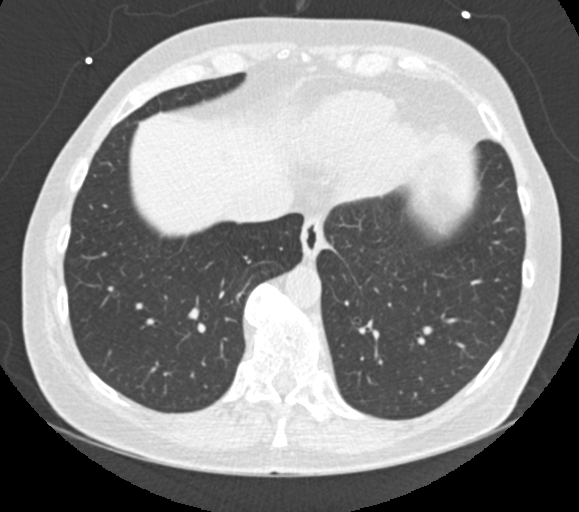
[im 27/80  vessel]
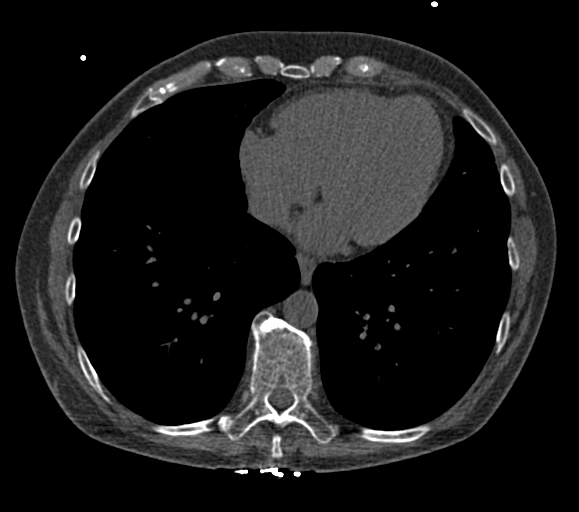
[im 40/80  vessel]
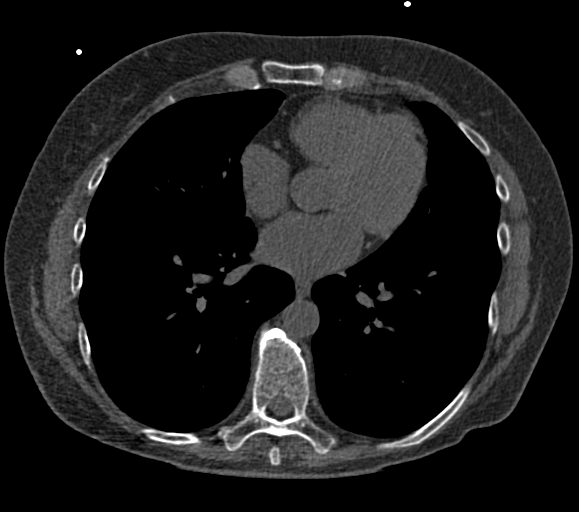
[im 53/80  vessel]
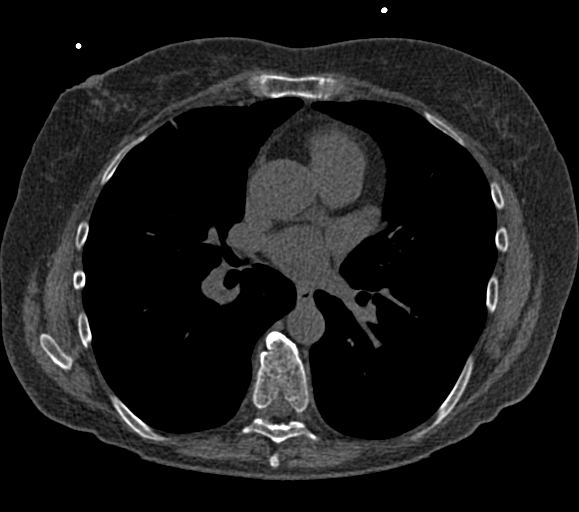
[im 66/80  vessel]
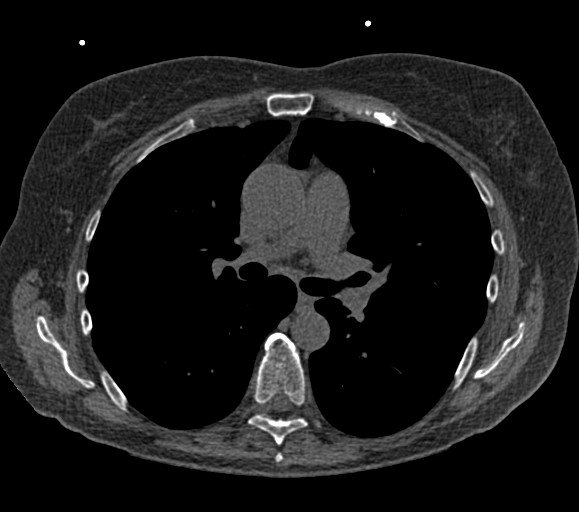
[im 66/80  lung]
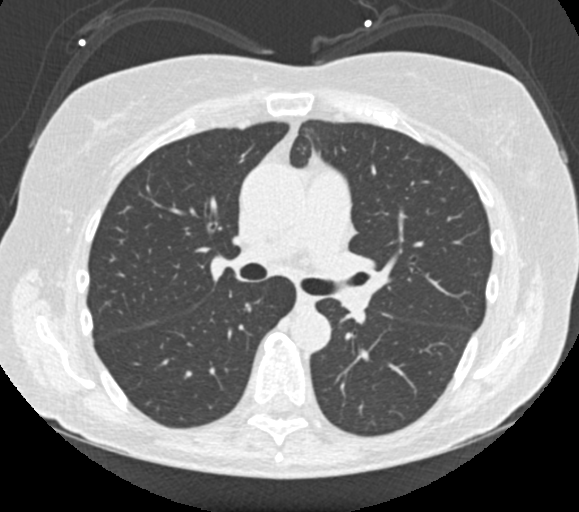

[Series 9: calcium scoring 2.00 br60 bestdiast 70% lungs · axial · 0.58mm/px · z∈[+1620,+1724]mm · 5 of 80 slices shown]
[im 14/80  vessel]
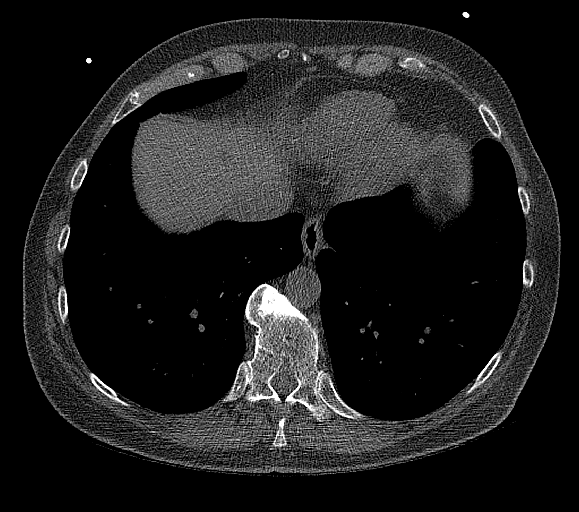
[im 27/80  vessel]
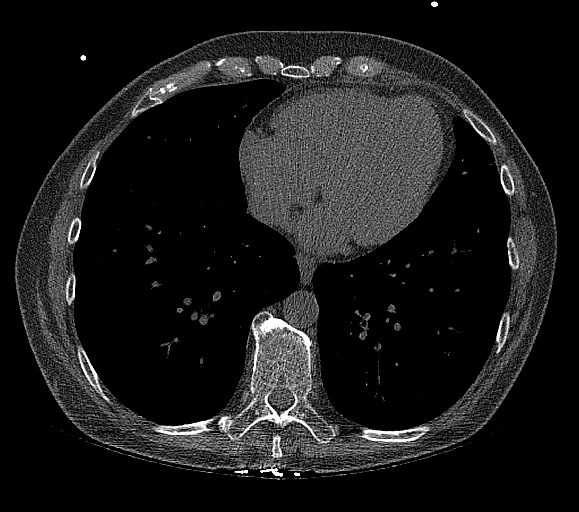
[im 40/80  vessel]
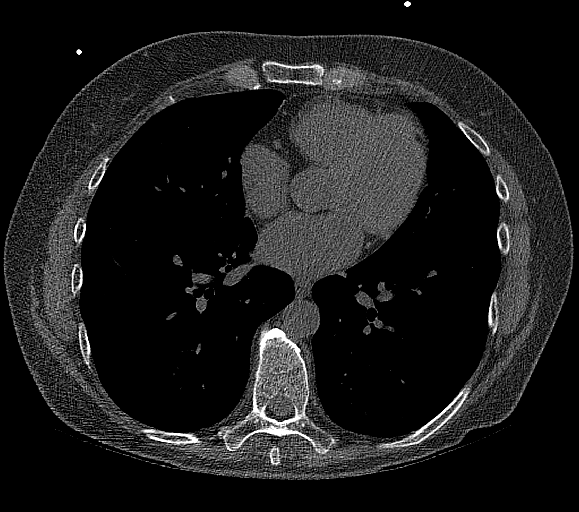
[im 53/80  vessel]
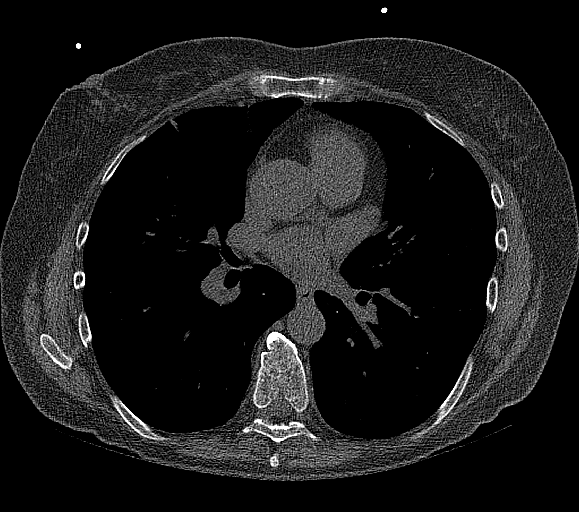
[im 66/80  vessel]
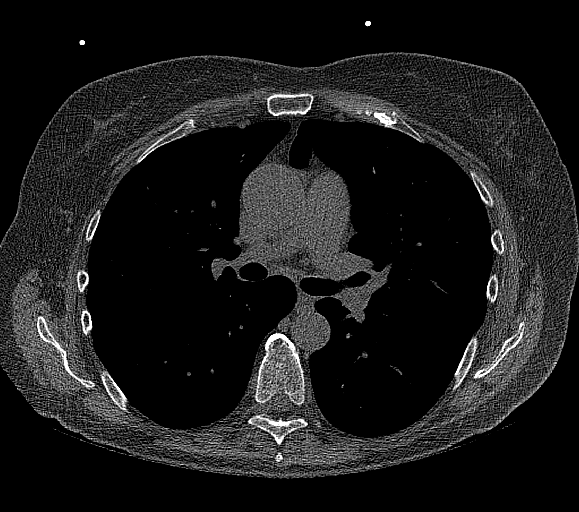

[14 of 20 positions shown; findings below may reference images not displayed]

FINDINGS: CORONARY CALCIUM SCORES:

Left Main: 0

LAD: 0

LCx: 6

RCA: 0

Total Agatston Score: 6

[HOSPITAL] percentile: 55

AORTA MEASUREMENTS:

Ascending Aorta: 37 mm

Descending Aorta: 21 mm

OTHER FINDINGS:

Heart is normal size. Scattered calcifications in the aortic root
and aortic arch. No adenopathy. Linear scarring lingula and right
middle lobe. No confluent opacities or effusions. Imaging into the
upper abdomen demonstrates no acute findings. Chest wall soft
tissues are unremarkable. No acute bony abnormality.
IMPRESSION: Total Agatston score: 6

[HOSPITAL] percentile: 55

Scattered aortic calcifications.

## 2023-11-26 ENCOUNTER — Ambulatory Visit (INDEPENDENT_AMBULATORY_CARE_PROVIDER_SITE_OTHER): Admitting: Gastroenterology

## 2023-11-26 ENCOUNTER — Encounter: Payer: Self-pay | Admitting: Gastroenterology

## 2023-11-26 VITALS — BP 132/78 | HR 76 | Ht 70.0 in | Wt 176.4 lb

## 2023-11-26 DIAGNOSIS — K5909 Other constipation: Secondary | ICD-10-CM

## 2023-11-26 DIAGNOSIS — K219 Gastro-esophageal reflux disease without esophagitis: Secondary | ICD-10-CM | POA: Diagnosis not present

## 2023-11-26 DIAGNOSIS — K5904 Chronic idiopathic constipation: Secondary | ICD-10-CM

## 2023-11-26 MED ORDER — FAMOTIDINE 20 MG PO TABS: 20.0000 mg | ORAL_TABLET | Freq: Every day | ORAL | 3 refills | Status: AC

## 2023-11-26 MED ORDER — PANTOPRAZOLE SODIUM 40 MG PO TBEC: 40.0000 mg | DELAYED_RELEASE_TABLET | Freq: Every day | ORAL | 3 refills | Status: AC

## 2023-11-26 NOTE — Progress Notes (Signed)
 Michelle Romero    [id number]    [birth year]  Primary Care Physician:Reddy, Cecille, MD  Referring Physician: Jess Cecille, MD [address] [place],  [place] [address]   Chief complaint:  GERD  Discussed the use of AI scribe software for clinical note transcription with the patient, who gave verbal consent to proceed.  History of Present Illness Michelle Romero is a 68 year old female who presents with nocturnal choking and coughing. She was referred by an ear, nose, and throat specialist for evaluation of acid reflux.  Nocturnal laryngospasm  - Nocturnal choking and coughing episodes, primarily occurring at night when lying down - Symptoms persist despite use of an adjustable bed - Frequent awakening with gagging and coughing, sometimes preventing return to sleep - Sensation of constant drainage rather than heartburn - No sore throat, cough, difficulty swallowing, chest pain, or stomach pain  Gastroesophageal reflux symptoms and management - Referred by otolaryngology for evaluation of acid reflux - Protonix  1 tablet daily in the morning provides effective symptom control - Symptom exacerbation occurs after two days if Protonix  dose is missed - Occasional use of Tums for severe nighttime symptoms - No heartburn reported  Bowel habits and gastrointestinal function - Bowel movements occur every three to four days - Stools are described as extraordinarily slow but not hard - No regular use of stool softeners or laxatives - Drinking water with magnesium can induce bowel movements - No blood in stool, no melena, and no significant abdominal pain - Negative Cologuard test in October 2024  Dietary and lifestyle factors - Diet high in fruits and vegetables - Lifetime member of [employer] with active weight management - No swelling in feet      Outpatient Encounter Medications as of 11/26/2023  Medication Sig   Alum Hydroxide-Mag Carbonate  (GAVISCON PO) Take by mouth as needed.   calcium carbonate (TUMS - DOSED IN MG ELEMENTAL CALCIUM) 500 MG chewable tablet Chew 1 tablet by mouth as needed for indigestion or heartburn.   pantoprazole  (PROTONIX ) 40 MG tablet Take 1 tablet (40 mg total) by mouth daily.   [DISCONTINUED] Cyanocobalamin (VITAMIN B 12) 250 MCG LOZG Take 1,000 mcg by mouth daily as needed.   [DISCONTINUED] famotidine  (PEPCID ) 20 MG tablet Take 1 tablet (20 mg total) by mouth daily. 1 tablet daily after dinner as needed (Patient not taking: Reported on 11/26/2023)   No facility-administered encounter medications on file as of 11/26/2023.    Allergies as of 11/26/2023 - Review Complete 11/26/2023  Allergen Reaction Noted   Codeine Other (See Comments) 01/08/2011   Mobic [meloxicam]  10/30/2011    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   Chicken pox as a child   DJD (degenerative joint disease) of hip 03/17/2012   Dupuytren's contracture of hand 01/30/2012   Elevated BP 11/30/2011   Fracture of distal end of radius 01/07/2018   GERD (gastroesophageal reflux disease)    Hemorrhoids 12/28/2012   History of blood transfusion    Hypercalcemia    Hyperlipidemia    Measles as a child   Osteopenia 05/2017   T score -1.5 FRAX 8.4% / 0.8%   PONV (postoperative nausea and vomiting)    Precancerous skin lesion 10/30/2011   Left abdomen, excised   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   Vertigo 10/30/2011  Vitamin D  deficiency     Past Surgical History:  Procedure Laterality Date   broke wrist Left 68 yrs old   plate placed by Dr Van at [facility]   COLONOSCOPY  2010   [facility]   SKIN LESION EXCISION     premelanoma on left abdominal wall   TONSILLECTOMY     TOTAL HIP ARTHROPLASTY  04/16/2012   Procedure: TOTAL HIP ARTHROPLASTY ANTERIOR APPROACH;  Surgeon: Dempsey LULLA Moan, MD;  Location: [facility];  Service:  Orthopedics;  Laterality: Right;    Family History  Problem Relation Age of Onset   Stroke Mother    Hypertension Mother    Diabetes Mother        type 2   Dementia Mother    Arthritis Mother    Arthritis Father    Alzheimer's disease Father    Melanoma Father        neck   Parkinson's disease Father    Fibromyalgia Sister    Ovarian cancer Sister 53   Fibromyalgia Sister    Endometriosis Sister        severe, had hyst   Arthritis Brother    Pancreatic cancer Maternal Grandmother    Heart attack Maternal Grandfather    Heart disease Maternal Grandfather    Cancer Paternal Grandmother        ?   Heart disease Paternal Grandfather        pacemaker   Esophageal cancer Neg Hx    Colon cancer Neg Hx    Stomach cancer Neg Hx     Social History   Socioeconomic History   Marital status: Widowed    Spouse name: Not on file   Number of children: 2   Years of education: 54   Highest education level: Not on file  Occupational History   Occupation: retired  Tobacco Use   Smoking status: Never   Smokeless tobacco: Never  Vaping Use   Vaping status: Never Used  Substance and Sexual Activity   Alcohol use: Not Currently    Alcohol/week: 1.0 standard drink of alcohol    Types: 1 Standard drinks or equivalent per week   Drug use: No   Sexual activity: Not Currently    Comment: 1st intercourse- 27, partner- 1, widow  Other Topics Concern   Not on file  Social History Narrative   Widowed. 2 children Arlean Earnie Savant and Redell Mass.   Masters degree. Retired.   Drinks caffeinated beverages. Uses herbal remedies.   Wears her seatbelt. Smoke detector in the home. Firearms in a locked cabinet in the home.   Exercises routinely.   Feels safe in her relationships.   Social Drivers of Corporate investment banker Strain: Not on file  Food Insecurity: Not on file  Transportation Needs: Not on file  Physical Activity: Not on file  Stress: Not on file  Social  Connections: Not on file  Intimate Partner Violence: Not on file      Review of systems: All other review of systems negative except as mentioned in the HPI.   Physical Exam: Vitals:   11/26/23 0837  BP: 132/78  Pulse: 76  SpO2: 97%   Body mass index is 25.31 kg/m. Gen:      No acute distress HEENT:  sclera anicteric CV: s1s2 rrr, no murmur Lungs: B/l clear. Abd:      soft, non-tender; no palpable masses, no distension Ext:    No edema Neuro: alert and oriented x 3 Psych:  normal mood and affect  Data Reviewed:  Reviewed labs, radiology imaging, old records and pertinent past GI work up     Assessment and Plan Assessment & Plan Gastroesophageal reflux disease without esophagitis GERD is well-controlled with Protonix , taken once daily in the morning. Symptoms include nocturnal choking and gagging due to drainage, particularly when lying down. No heartburn or sore throat reported. Symptoms worsen if Protonix  is skipped for more than a day. - Continue Protonix  once daily. - Try taking Protonix  20 minutes before supper to assess effectiveness. - Consider famotidine  (Pepcid ) 20mg  at bedtime if symptoms persist, but discontinue if stomach discomfort occurs. - Advise eating 3-4 hours before bedtime to reduce symptoms.  Chronic constipation Chronic constipation with bowel movements every 3-4 days. No significant pain or difficulty swallowing. Bloating and backed-up sensation noted, but no masses detected. Consumes a diet rich in fruits and vegetables. - Recommend docusate (Colace) stool softener, one at bedtime or in the morning. - Start Miralax  at half capful dosage daily in the morning. - Increase fiber intake with Benefiber, one tablespoon 2-3 times daily with meals. - Encourage increased water intake and consumption of fruits and vegetables.  Return in 1-2 years      This visit required >30 minutes of patient care (this includes precharting, chart review, review of  results, face-to-face time used for counseling as well as treatment plan and follow-up. The patient was provided an opportunity to ask questions and all were answered. The patient agreed with the plan and demonstrated an understanding of the instructions.  LOIS Wilkie Mcgee , MD    CC: Jess Llano, MD

## 2023-11-26 NOTE — Patient Instructions (Addendum)
 VISIT SUMMARY:  Follow up in 1 year   We sent your refills to your [facility] Order (Pantoprazole   Pepcid )   During your visit, we discussed your nighttime choking and coughing, which are likely due to acid reflux, and your chronic constipation. We reviewed your current medications and made some adjustments to help manage your symptoms more effectively.  YOUR PLAN:  GASTROESOPHAGEAL REFLUX DISEASE (GERD): Your GERD is causing nighttime choking and coughing, especially when lying down. Your symptoms are well-controlled with Protonix , but they worsen if you miss a dose. -Continue taking Protonix  once daily. -Try taking Protonix  40mg  daily 20 minutes before supper to see if it helps. -If symptoms persist, consider taking famotidine  (Pepcid ) 20mg  at bedtime, but stop if you experience stomach discomfort. -Eat 3-4 hours before bedtime to help reduce symptoms.  CHRONIC CONSTIPATION: You have chronic constipation with bowel movements every 3-4 days. You experience bloating but no significant pain. -Take docusate (Colace) 1-2 capsules as needed stool softener, one at bedtime or in the morning. -Start taking Miralax  at half capful dosage daily in the morning. -Increase your fiber intake with Benefiber, one tablespoon 2-3 times daily with meals. -Drink more water and continue eating plenty of fruits and vegetables.  _______________________________________________________  If your blood pressure at your visit was 140/90 or greater, please contact your primary care physician to follow up on this.  _______________________________________________________  If you are age 73 or older, your body mass index should be between 23-30. Your Body mass index is 25.31 kg/m. If this is out of the aforementioned range listed, please consider follow up with your Primary Care Provider.  If you are age 59 or younger, your body mass index should be between 19-25. Your Body mass index is 25.31 kg/m. If this is out  of the aformentioned range listed, please consider follow up with your Primary Care Provider.   ________________________________________________________  The [facility] providers would like to encourage you to use MYCHART to communicate with providers for non-urgent requests or questions.  Due to long hold times on the telephone, sending your provider a message by [facility] may be a faster and more efficient way to get a response.  Please allow 48 business hours for a response.  Please remember that this is for non-urgent requests.  _______________________________________________________  [facility] is using a team-based approach to care.  Your team is made up of your doctor and two to three APPS. Our APPS (Nurse Practitioners and Physician Assistants) work with your physician to ensure care continuity for you. They are fully qualified to address your health concerns and develop a treatment plan. They communicate directly with your gastroenterologist to care for you. Seeing the Advanced Practice Practitioners on your physician's team can help you by facilitating care more promptly, often allowing for earlier appointments, access to diagnostic testing, procedures, and other specialty referrals.   I appreciate the  opportunity to care for you  Thank You   Kavitha Nandigam , MD

## 2023-11-27 ENCOUNTER — Encounter: Payer: Self-pay | Admitting: Gastroenterology
